# Patient Record
Sex: Male | Born: 1950
Health system: Southern US, Community
[De-identification: ages and names within clinical notes are randomized; demographics above are authoritative.]

## PROBLEM LIST (undated history)

## (undated) DIAGNOSIS — I10 Essential (primary) hypertension: Secondary | ICD-10-CM

## (undated) DIAGNOSIS — E785 Hyperlipidemia, unspecified: Secondary | ICD-10-CM

## (undated) DIAGNOSIS — N189 Chronic kidney disease, unspecified: Secondary | ICD-10-CM

## (undated) DIAGNOSIS — C61 Malignant neoplasm of prostate: Secondary | ICD-10-CM

## (undated) HISTORY — DX: Malignant neoplasm of prostate: C61

## (undated) HISTORY — PX: COLONOSCOPY: SHX174

## (undated) HISTORY — PX: PROSTATE BIOPSY: SHX241

## (undated) HISTORY — PX: PROSTATE SURGERY: SHX751

---

## 2000-04-13 ENCOUNTER — Encounter: Payer: Self-pay | Admitting: Specialist

## 2000-04-13 ENCOUNTER — Encounter: Admission: RE | Admit: 2000-04-13 | Discharge: 2000-04-13 | Payer: Self-pay | Admitting: Specialist

## 2008-12-01 HISTORY — PX: OTHER SURGICAL HISTORY: SHX169

## 2010-03-05 ENCOUNTER — Ambulatory Visit (HOSPITAL_COMMUNITY)
Admission: RE | Admit: 2010-03-05 | Discharge: 2010-03-06 | Payer: Self-pay | Source: Home / Self Care | Admitting: Orthopedic Surgery

## 2010-03-05 HISTORY — PX: OTHER SURGICAL HISTORY: SHX169

## 2011-02-19 LAB — COMPREHENSIVE METABOLIC PANEL
ALT: 16 U/L (ref 0–53)
AST: 17 U/L (ref 0–37)
Albumin: 4.1 g/dL (ref 3.5–5.2)
Alkaline Phosphatase: 60 U/L (ref 39–117)
BUN: 11 mg/dL (ref 6–23)
CO2: 26 mEq/L (ref 19–32)
Calcium: 9.5 mg/dL (ref 8.4–10.5)
Chloride: 105 mEq/L (ref 96–112)
Creatinine, Ser: 0.93 mg/dL (ref 0.4–1.5)
GFR calc Af Amer: >60 mL/min (ref >60)
GFR calc non Af Amer: >60 mL/min (ref >60)
Glucose, Bld: 94 mg/dL (ref 70–99)
Potassium: 3.1 mEq/L — ABNORMAL LOW (ref 3.5–5.1)
Sodium: 136 mEq/L (ref 135–145)
Total Bilirubin: 0.9 mg/dL (ref 0.3–1.2)
Total Protein: 8 g/dL (ref 6.0–8.3)

## 2011-02-19 LAB — CBC
HCT: 31.6 % — ABNORMAL LOW (ref 39.0–52.0)
Hemoglobin: 10.8 g/dL — ABNORMAL LOW (ref 13.0–17.0)
MCHC: 34 g/dL (ref 30.0–36.0)
MCV: 102.4 fL — ABNORMAL HIGH (ref 78.0–100.0)
Platelets: 236 10*3/uL (ref 150–400)
RBC: 3.09 MIL/uL — ABNORMAL LOW (ref 4.22–5.81)
RDW: 15.6 % — ABNORMAL HIGH (ref 11.5–15.5)
WBC: 7.8 10*3/uL (ref 4.0–10.5)

## 2011-02-19 LAB — URINALYSIS, ROUTINE W REFLEX MICROSCOPIC
Bilirubin Urine: NEGATIVE
Glucose, UA: NEGATIVE mg/dL
Hgb urine dipstick: NEGATIVE
Ketones, ur: NEGATIVE mg/dL
Leukocytes, UA: NEGATIVE
Nitrite: NEGATIVE
Protein, ur: 30 mg/dL — AB
Specific Gravity, Urine: 1.02 (ref 1.005–1.030)
Urobilinogen, UA: 1 mg/dL (ref 0.0–1.0)
pH: 5.5 (ref 5.0–8.0)

## 2011-02-19 LAB — PROTIME-INR
INR: 1.02 (ref 0.00–1.49)
Prothrombin Time: 13.3 seconds (ref 11.6–15.2)

## 2011-02-19 LAB — APTT: aPTT: 30 seconds (ref 24–37)

## 2011-02-19 LAB — URINE MICROSCOPIC-ADD ON

## 2011-11-10 ENCOUNTER — Ambulatory Visit (INDEPENDENT_AMBULATORY_CARE_PROVIDER_SITE_OTHER): Payer: Self-pay | Admitting: Family Medicine

## 2011-11-10 DIAGNOSIS — E785 Hyperlipidemia, unspecified: Secondary | ICD-10-CM

## 2011-11-10 DIAGNOSIS — I10 Essential (primary) hypertension: Secondary | ICD-10-CM

## 2012-03-18 ENCOUNTER — Other Ambulatory Visit: Payer: Self-pay | Admitting: Family Medicine

## 2012-05-03 ENCOUNTER — Ambulatory Visit (INDEPENDENT_AMBULATORY_CARE_PROVIDER_SITE_OTHER): Payer: BC Managed Care – PPO | Admitting: Family Medicine

## 2012-05-03 ENCOUNTER — Encounter: Payer: Self-pay | Admitting: Family Medicine

## 2012-05-03 VITALS — BP 127/80 | HR 84 | Temp 98.0°F | Resp 16 | Ht 69.5 in | Wt 190.4 lb

## 2012-05-03 DIAGNOSIS — I10 Essential (primary) hypertension: Secondary | ICD-10-CM

## 2012-05-03 DIAGNOSIS — E785 Hyperlipidemia, unspecified: Secondary | ICD-10-CM

## 2012-05-03 MED ORDER — PRAVASTATIN SODIUM 40 MG PO TABS: 40.0000 mg | ORAL_TABLET | Freq: Every day | ORAL | Status: DC

## 2012-05-03 MED ORDER — VERAPAMIL HCL 180 MG (CO) PO TB24: 180.0000 mg | ORAL_TABLET | Freq: Every day | ORAL | Status: DC

## 2012-05-03 MED ORDER — HYDROCHLOROTHIAZIDE 25 MG PO TABS: 25.0000 mg | ORAL_TABLET | Freq: Every day | ORAL | Status: DC

## 2012-05-03 NOTE — Progress Notes (Signed)
  Subjective:    Patient ID: Alan Shah, male    DOB: [birth year], 61 y.o.   MRN: [id number]  HPI  Patient presents to clinic for routine follow up. Compliant with medications and denies side effects.  Retired  Exercises 1+ hours everyday Careful about food choices and portions  Health Maintenance:  Colonoscopy-2011 Tdap-2011 Zostavax-2012  Review of Systems     Objective:   Physical Exam  Constitutional: He appears well-developed.  Neck: Neck supple. No thyromegaly present.  Cardiovascular: Normal rate, regular rhythm and normal heart sounds.   Pulmonary/Chest: Effort normal and breath sounds normal.  Abdominal: Soft. Bowel sounds are normal.       No HSM  Neurological: He is alert.  Skin: Skin is warm.         Assessment & Plan:   1. HTN (hypertension)  verapamil (COVERA HS) 180 MG (CO) 24 hr tablet, hydrochlorothiazide (HYDRODIURIL) 25 MG tablet, Comprehensive metabolic panel  2. Dyslipidemia  pravastatin (PRAVACHOL) 40 MG tablet, Lipid panel   Follow up 2014 for CPE

## 2012-05-04 LAB — COMPREHENSIVE METABOLIC PANEL
ALT: 13 U/L (ref 0–53)
AST: 18 U/L (ref 0–37)
Albumin: 4.5 g/dL (ref 3.5–5.2)
Alkaline Phosphatase: 51 U/L (ref 39–117)
BUN: 36 mg/dL — ABNORMAL HIGH (ref 6–23)
CO2: 25 mEq/L (ref 19–32)
Calcium: 10 mg/dL (ref 8.4–10.5)
Chloride: 106 mEq/L (ref 96–112)
Creat: 1.59 mg/dL — ABNORMAL HIGH (ref 0.50–1.35)
Glucose, Bld: 81 mg/dL (ref 70–99)
Potassium: 4.4 mEq/L (ref 3.5–5.3)
Sodium: 140 mEq/L (ref 135–145)
Total Bilirubin: 0.5 mg/dL (ref 0.3–1.2)
Total Protein: 7.8 g/dL (ref 6.0–8.3)

## 2012-05-04 LAB — LIPID PANEL
Cholesterol: 170 mg/dL (ref 0–200)
HDL: 61 mg/dL (ref >39)
LDL Cholesterol: 98 mg/dL (ref 0–99)
Total CHOL/HDL Ratio: 2.8 Ratio
Triglycerides: 53 mg/dL (ref <150)
VLDL: 11 mg/dL (ref 0–40)

## 2012-06-01 ENCOUNTER — Encounter: Payer: Self-pay | Admitting: Family Medicine

## 2012-06-01 ENCOUNTER — Ambulatory Visit (INDEPENDENT_AMBULATORY_CARE_PROVIDER_SITE_OTHER): Payer: BC Managed Care – PPO | Admitting: Family Medicine

## 2012-06-01 VITALS — BP 127/78 | HR 87 | Temp 98.6°F | Resp 16 | Ht 68.5 in | Wt 194.0 lb

## 2012-06-01 DIAGNOSIS — I10 Essential (primary) hypertension: Secondary | ICD-10-CM | POA: Insufficient documentation

## 2012-06-01 DIAGNOSIS — N289 Disorder of kidney and ureter, unspecified: Secondary | ICD-10-CM

## 2012-06-01 DIAGNOSIS — E663 Overweight: Secondary | ICD-10-CM | POA: Insufficient documentation

## 2012-06-01 LAB — BASIC METABOLIC PANEL
BUN: 26 mg/dL — ABNORMAL HIGH (ref 6–23)
CO2: 24 mEq/L (ref 19–32)
Calcium: 9.9 mg/dL (ref 8.4–10.5)
Chloride: 105 mEq/L (ref 96–112)
Creat: 1.33 mg/dL (ref 0.50–1.35)
Glucose, Bld: 88 mg/dL (ref 70–99)
Potassium: 4.1 mEq/L (ref 3.5–5.3)
Sodium: 139 mEq/L (ref 135–145)

## 2012-06-01 NOTE — Progress Notes (Signed)
  Subjective:    Patient ID: Alan Shah, male    DOB: [birth year], 62 y.o.   MRN: [id number]  HPI   This 61 y.o. AA male has HTN who was asked to return for recheck of Renal function. He is compliant with  BP meds (Verapamil and HCTZ). He maintains adequate hydration, even when exercising at home (treadmill  and strength training). He avoids most caffeine but does consume sweet tea. He is retired from [employer] and stays  active with yard work.   He denies HA, CP, SOB, dizziness, syncope or weakness.    Review of Systems  Constitutional: Negative.   Eyes: Negative for visual disturbance.  Respiratory: Negative for cough, chest tightness and shortness of breath.   Cardiovascular: Negative for chest pain, palpitations and leg swelling.  Musculoskeletal: Negative.   Neurological: Positive for dizziness. Negative for syncope, weakness, light-headedness and headaches.       Objective:   Physical Exam  Nursing note and vitals reviewed. Constitutional: He is oriented to person, place, and time. He appears well-developed and well-nourished. No distress.  HENT:  Head: Normocephalic and atraumatic.  Eyes: Conjunctivae are normal. No scleral icterus.  Cardiovascular: Normal rate and regular rhythm.   Pulmonary/Chest: Effort normal. No respiratory distress.  Musculoskeletal: He exhibits no edema.  Neurological: He is alert and oriented to person, place, and time. No cranial nerve deficit. Coordination normal.  Skin: Skin is warm and dry.          Assessment & Plan:   1. HTN (hypertension)  Basic metabolic panel, Vitamin D, 25-hydroxy Continue current meds and advised maintaining hydration, esp with yard work and aerobic exercise  2. Renal insufficiency - BUN and Cr elevated suggesting mild dehydration (2 years ago Cr=0.93)

## 2012-06-01 NOTE — Patient Instructions (Signed)
Vitamin D Deficiency  Not having enough vitamin D is called a deficiency. Your body needs this vitamin to keep your bones strong and healthy. Having too little of it can make your bones soft or can cause other health problems.  HOME CARE  Take all vitamins, herbs, or nutrition drinks (supplements) as told by your doctor.   Have your blood tested 2 months after taking vitamins, herbs, or nutrition drinks.   Eat foods that have vitamin D. This includes:   Dairy products, cereals, or juices with added vitamin D. Check the label.   Fatty fish like salmon or trout.   Eggs.   Oysters.   Go outside for 10 to 15 minutes when the sun is shining. Do this 3 times a week. Do not do this if you have skin cancer.   Do not use tanning beds.   Stay at a healthy weight. Lose weight if needed.   Keep all doctor visits as told.  GET HELP IF:  You have questions.   You continue to have problems.   You feel sick to your stomach (nauseous) or throw up (vomit).   You cannot go poop (constipated).   You feel confused.   You have severe belly (abdominal) or back pain.  MAKE SURE YOU:  Understand these instructions.   Will watch your condition.   Will get help right away if you are not doing well or get worse.  Document Released: 11/06/2011 Document Reviewed: 11/04/2011 [facility] Patient Information 2012 [place].

## 2012-06-02 ENCOUNTER — Other Ambulatory Visit: Payer: Self-pay | Admitting: Family Medicine

## 2012-06-02 LAB — VITAMIN D 25 HYDROXY (VIT D DEFICIENCY, FRACTURES): Vit D, 25-Hydroxy: 14 ng/mL — ABNORMAL LOW (ref 30–89)

## 2012-06-02 MED ORDER — ERGOCALCIFEROL 1.25 MG (50000 UT) PO CAPS: 50000.0000 [IU] | ORAL_CAPSULE | ORAL | Status: DC

## 2012-06-02 NOTE — Progress Notes (Signed)
Quick Note:  Please call pt and advise that the renal labs are normal; his kidney function has improved since last check 1 month ago. Continue current medications and maintain good hydration as we discussed.  His Vitamin D level is low; I am prescribing Vitamin D 50,000 IU 1 capsule once a week until there are no more refills. Medication is at pharmacy. Get the Multivitamin for Men that we discussed.  Need to get 10-15 minutes of sun exposure each day and try to consume salmon / tuna/ sardines, eggs, mushrooms and some dairy products for Vitamin D.  Copy to pt.  ______

## 2012-06-17 ENCOUNTER — Encounter: Payer: Self-pay | Admitting: Family Medicine

## 2012-06-18 ENCOUNTER — Encounter: Payer: Self-pay | Admitting: Family Medicine

## 2012-07-11 ENCOUNTER — Other Ambulatory Visit: Payer: Self-pay | Admitting: Physician Assistant

## 2012-09-25 ENCOUNTER — Encounter (HOSPITAL_COMMUNITY): Payer: Self-pay | Admitting: Emergency Medicine

## 2012-09-25 ENCOUNTER — Emergency Department (HOSPITAL_COMMUNITY)
Admission: EM | Admit: 2012-09-25 | Discharge: 2012-09-26 | Disposition: A | Discharge status: Home / Self Care | Payer: BC Managed Care – PPO | Attending: Emergency Medicine | Admitting: Emergency Medicine

## 2012-09-25 DIAGNOSIS — IMO0002 Reserved for concepts with insufficient information to code with codable children: Secondary | ICD-10-CM | POA: Insufficient documentation

## 2012-09-25 DIAGNOSIS — F1010 Alcohol abuse, uncomplicated: Secondary | ICD-10-CM | POA: Insufficient documentation

## 2012-09-25 DIAGNOSIS — W1809XA Striking against other object with subsequent fall, initial encounter: Secondary | ICD-10-CM | POA: Insufficient documentation

## 2012-09-25 DIAGNOSIS — Y92009 Unspecified place in unspecified non-institutional (private) residence as the place of occurrence of the external cause: Secondary | ICD-10-CM | POA: Insufficient documentation

## 2012-09-25 DIAGNOSIS — W19XXXA Unspecified fall, initial encounter: Secondary | ICD-10-CM

## 2012-09-25 DIAGNOSIS — F10929 Alcohol use, unspecified with intoxication, unspecified: Secondary | ICD-10-CM

## 2012-09-25 DIAGNOSIS — E7800 Pure hypercholesterolemia, unspecified: Secondary | ICD-10-CM | POA: Insufficient documentation

## 2012-09-25 DIAGNOSIS — I10 Essential (primary) hypertension: Secondary | ICD-10-CM | POA: Insufficient documentation

## 2012-09-25 DIAGNOSIS — Y9389 Activity, other specified: Secondary | ICD-10-CM | POA: Insufficient documentation

## 2012-09-25 DIAGNOSIS — R4182 Altered mental status, unspecified: Secondary | ICD-10-CM | POA: Insufficient documentation

## 2012-09-25 DIAGNOSIS — S0003XA Contusion of scalp, initial encounter: Secondary | ICD-10-CM | POA: Insufficient documentation

## 2012-09-25 DIAGNOSIS — N289 Disorder of kidney and ureter, unspecified: Secondary | ICD-10-CM | POA: Insufficient documentation

## 2012-09-25 DIAGNOSIS — Z7982 Long term (current) use of aspirin: Secondary | ICD-10-CM | POA: Insufficient documentation

## 2012-09-25 DIAGNOSIS — S0083XA Contusion of other part of head, initial encounter: Secondary | ICD-10-CM | POA: Insufficient documentation

## 2012-09-25 HISTORY — DX: Essential (primary) hypertension: I10

## 2012-09-25 NOTE — ED Notes (Signed)
Pt fell in his bathroom. Pt head went through part of a wall. Patient immobilized. Pt has ETOH on board. Some repetitive questions. Altered LOC, not following commands at this point.

## 2012-09-25 NOTE — ED Notes (Signed)
Pt has bilateral hematomas over eyes. Abrasion noted L forearm.

## 2012-09-26 ENCOUNTER — Emergency Department (HOSPITAL_COMMUNITY): Payer: BC Managed Care – PPO

## 2012-09-26 LAB — ETHANOL: Alcohol, Ethyl (B): 402 mg/dL (ref 0–11)

## 2012-09-26 NOTE — ED Provider Notes (Signed)
History     CSN: [id number]  Arrival date & time 09/25/12  2355   First MD Initiated Contact with Patient 09/26/12 0020      Chief Complaint  Patient presents with  . Fall    (Consider location/radiation/quality/duration/timing/severity/associated sxs/prior treatment) HPI Comments: Patient presents from home after a fall that occurred approximately 2 hours ago. Patient is alert however is only oriented to person. Friend is at bedside. Friend was at the patient's house, where he lives alone, and states he is acting normally. Patient went to bed and the friend heard the patient fall. Patient apparently struck the front of his head against a wall. Patient admits to drinking 2 beers prior to arrival. He denies chest pain or abdominal pain. Patient denies pain in his arms or his legs. He denies headache. Denies blurry vision. No nausea or vomiting. Onset was acute. Course is constant. Nothing makes symptoms better worse. Per previous medical records patient has history of hypertension, high cholesterol, renal insufficiency. The patient does not take any blood thinning medications.  The history is provided by the patient and a friend.    Past Medical History  Diagnosis Date  . Hypertension     History reviewed. No pertinent past surgical history.  No family history on file.  History  Substance Use Topics  . Smoking status: Never Smoker   . Smokeless tobacco: Not on file  . Alcohol Use: Not on file      Review of Systems  Unable to perform ROS: Mental status change    Allergies  Review of patient's allergies indicates no known allergies.  Home Medications   Current Outpatient Rx  Name Route Sig Dispense Refill  . ASPIRIN 81 MG PO TABS Oral Take 81 mg by mouth daily.    . ERGOCALCIFEROL 50000 UNITS PO CAPS Oral Take 1 capsule (50,000 Units total) by mouth once a week. 4 capsule 5  . HYDROCHLOROTHIAZIDE 25 MG PO TABS Oral Take 1 tablet (25 mg total) by mouth daily. 90  tablet 2  . PRAVASTATIN SODIUM 40 MG PO TABS Oral Take 1 tablet (40 mg total) by mouth daily. 90 tablet 2  . VERAPAMIL HCL ER (CO) 180 MG PO TB24 Oral Take 1 tablet (180 mg total) by mouth at bedtime. 90 tablet 2    BP 127/78  Pulse 85  Temp 97.9 F (36.6 C) (Oral)  Resp 18  SpO2 100%  Physical Exam  Nursing note and vitals reviewed. Constitutional: He appears well-developed and well-nourished.  HENT:  Head: Normocephalic. Head is without raccoon's eyes and without Battle's sign.  Right Ear: Tympanic membrane, external ear and ear canal normal. No hemotympanum.  Left Ear: Tympanic membrane, external ear and ear canal normal. No hemotympanum.  Nose: Nose normal. No nasal septal hematoma.  Mouth/Throat: Oropharynx is clear and moist.       Contusion/abrasion bilateral forehead  Eyes: Conjunctivae normal, EOM and lids are normal. Pupils are equal, round, and reactive to light.       No visible hyphema  Neck: Normal range of motion. Neck supple. No tracheal deviation present.  Cardiovascular: Normal rate and regular rhythm.   Pulmonary/Chest: Effort normal and breath sounds normal. No respiratory distress. He exhibits no tenderness.  Abdominal: Soft. Bowel sounds are normal. There is no tenderness. There is no rebound and no guarding.  Musculoskeletal: Normal range of motion.       Cervical back: He exhibits normal range of motion, no tenderness and no bony tenderness.  Thoracic back: He exhibits no tenderness and no bony tenderness.       Lumbar back: He exhibits no tenderness and no bony tenderness.  Neurological: He is alert. He has normal strength and normal reflexes. Coordination normal. GCS eye subscore is 4. GCS verbal subscore is 5. GCS motor subscore is 6.       Neuro exam limited 2/2 ETOH intoxication  Skin: Skin is warm and dry.  Psychiatric: He has a normal mood and affect.    ED Course  Procedures (including critical care time)  Labs Reviewed  ETHANOL -  Abnormal; Notable for the following:    Alcohol, Ethyl (B) 402 (*)     All other components within normal limits  LAB REPORT - SCANNED   Ct Head Wo Contrast  09/26/2012  *RADIOLOGY REPORT*  Clinical Data:  Fall, head injury  CT HEAD WITHOUT CONTRAST CT CERVICAL SPINE WITHOUT CONTRAST  Technique:  Multidetector CT imaging of the head and cervical spine was performed following the standard protocol without intravenous contrast.  Multiplanar CT image reconstructions of the cervical spine were also generated.  Comparison:  None.  CT HEAD  Findings: No evidence of parenchymal hemorrhage or extra-axial fluid collection. No mass lesion, mass effect, or midline shift.  No CT evidence of acute infarction.  Subcortical white matter and periventricular small vessel ischemic changes.  Right basal ganglia lacunar infarct.  Global cortical atrophy.  Secondary ventricular prominence.  The visualized paranasal sinuses are essentially clear. The mastoid air cells are unopacified.  No evidence of calvarial fracture.  IMPRESSION: No evidence of acute intracranial abnormality.  Right basal ganglia lacunar infarct.  Atrophy with small vessel ischemic changes.  CT CERVICAL SPINE  Findings: Straightening of the cervical spine, positive positional.  No evidence of fracture or dislocation.  Vertebral body heights are maintained.  The dens appears intact.  No prevertebral soft tissue swelling.  Moderate multilevel degenerative changes.  Visualized thyroid is unremarkable.  Visualized lung apices are essentially clear.  IMPRESSION: No evidence of traumatic injury to the cervical spine.  Moderate multilevel degenerative changes.   Original Report Authenticated By: Julian Hy, M.D.    Ct Cervical Spine Wo Contrast  09/26/2012  *RADIOLOGY REPORT*  Clinical Data:  Fall, head injury  CT HEAD WITHOUT CONTRAST CT CERVICAL SPINE WITHOUT CONTRAST  Technique:  Multidetector CT imaging of the head and cervical spine was performed  following the standard protocol without intravenous contrast.  Multiplanar CT image reconstructions of the cervical spine were also generated.  Comparison:  None.  CT HEAD  Findings: No evidence of parenchymal hemorrhage or extra-axial fluid collection. No mass lesion, mass effect, or midline shift.  No CT evidence of acute infarction.  Subcortical white matter and periventricular small vessel ischemic changes.  Right basal ganglia lacunar infarct.  Global cortical atrophy.  Secondary ventricular prominence.  The visualized paranasal sinuses are essentially clear. The mastoid air cells are unopacified.  No evidence of calvarial fracture.  IMPRESSION: No evidence of acute intracranial abnormality.  Right basal ganglia lacunar infarct.  Atrophy with small vessel ischemic changes.  CT CERVICAL SPINE  Findings: Straightening of the cervical spine, positive positional.  No evidence of fracture or dislocation.  Vertebral body heights are maintained.  The dens appears intact.  No prevertebral soft tissue swelling.  Moderate multilevel degenerative changes.  Visualized thyroid is unremarkable.  Visualized lung apices are essentially clear.  IMPRESSION: No evidence of traumatic injury to the cervical spine.  Moderate multilevel degenerative changes.  Original Report Authenticated By: Julian Hy, M.D.      1. Fall   2. Alcohol intoxication     12:27 AM Patient seen and examined. Work-up initiated. CT ordered. No blood thinners per records.   Vital signs reviewed and are as follows: Filed Vitals:   09/26/12 0021  BP: 127/78  Pulse: 85  Temp:   Resp: 18   CT reviewed by myself. Radiologist report reviewed. No acute process.   Patient d/w Dr. Dorna Mai. ETOH ordered. Patient now AAOx3 after CT.   ETOH > 400. Will monitor.   Patient's friend agrees to transport home and watch tonight.   Patient/friend urged to return with worsening symptoms or other concerns. Patient verbalized understanding and agrees  with plan.   Patient was counseled on head injury precautions and symptoms that should indicate their return to the ED.  These include severe worsening headache, vision changes, confusion, loss of consciousness, trouble walking, nausea & vomiting, or weakness/tingling in extremities.      MDM  Fall, iniitally not fully oriented. Imaging neg. Improved in ED -- ETOH > 400. Confusion likely 2/2 ETOH intoxication. Patient has friend who will monitor at home. No concern for closed head injury given improvement and CT findings. No other injuries noted.         Carlisle Cater, [place] 09/27/12 1932

## 2012-09-26 NOTE — ED Notes (Signed)
Pt. Has ride home and has someone to watch over him tonight to ensure safety

## 2012-10-04 NOTE — ED Provider Notes (Signed)
Medical screening examination/treatment/procedure(s) were performed by non-physician practitioner and as supervising physician I was immediately available for consultation/collaboration.   Saddie Benders. Dorna Mai, MD 10/04/12 2119

## 2012-11-07 ENCOUNTER — Other Ambulatory Visit: Payer: Self-pay | Admitting: Family Medicine

## 2012-12-01 ENCOUNTER — Other Ambulatory Visit: Payer: Self-pay | Admitting: Radiology

## 2012-12-01 DIAGNOSIS — I10 Essential (primary) hypertension: Secondary | ICD-10-CM

## 2012-12-01 MED ORDER — VERAPAMIL HCL 180 MG (CO) PO TB24: 180.0000 mg | ORAL_TABLET | Freq: Every day | ORAL | Status: DC

## 2012-12-01 MED ORDER — HYDROCHLOROTHIAZIDE 25 MG PO TABS: 25.0000 mg | ORAL_TABLET | Freq: Every day | ORAL | Status: DC

## 2013-01-05 ENCOUNTER — Ambulatory Visit (INDEPENDENT_AMBULATORY_CARE_PROVIDER_SITE_OTHER): Payer: BC Managed Care – PPO | Admitting: Family Medicine

## 2013-01-05 ENCOUNTER — Encounter: Payer: Self-pay | Admitting: Family Medicine

## 2013-01-05 VITALS — BP 139/82 | HR 76 | Temp 98.2°F | Resp 16 | Ht 68.5 in | Wt 186.0 lb

## 2013-01-05 DIAGNOSIS — E7800 Pure hypercholesterolemia, unspecified: Secondary | ICD-10-CM

## 2013-01-05 DIAGNOSIS — R195 Other fecal abnormalities: Secondary | ICD-10-CM

## 2013-01-05 DIAGNOSIS — E785 Hyperlipidemia, unspecified: Secondary | ICD-10-CM

## 2013-01-05 DIAGNOSIS — Z0000 Encounter for general adult medical examination without abnormal findings: Secondary | ICD-10-CM

## 2013-01-05 DIAGNOSIS — I10 Essential (primary) hypertension: Secondary | ICD-10-CM

## 2013-01-05 DIAGNOSIS — N400 Enlarged prostate without lower urinary tract symptoms: Secondary | ICD-10-CM

## 2013-01-05 DIAGNOSIS — E559 Vitamin D deficiency, unspecified: Secondary | ICD-10-CM

## 2013-01-05 LAB — POCT URINALYSIS DIPSTICK
Bilirubin, UA: NEGATIVE
Blood, UA: NEGATIVE
Glucose, UA: NEGATIVE
Ketones, UA: NEGATIVE
Leukocytes, UA: NEGATIVE
Nitrite, UA: NEGATIVE
Protein, UA: 30
Spec Grav, UA: 1.025
Urobilinogen, UA: 0.2
pH, UA: 5.5

## 2013-01-05 LAB — IFOBT (OCCULT BLOOD): IFOBT: POSITIVE

## 2013-01-05 MED ORDER — VERAPAMIL HCL 180 MG (CO) PO TB24: 180.0000 mg | ORAL_TABLET | Freq: Every day | ORAL | Status: DC

## 2013-01-05 MED ORDER — HYDROCHLOROTHIAZIDE 25 MG PO TABS: 25.0000 mg | ORAL_TABLET | Freq: Every day | ORAL | Status: DC

## 2013-01-05 MED ORDER — PRAVASTATIN SODIUM 40 MG PO TABS: 40.0000 mg | ORAL_TABLET | Freq: Every day | ORAL | Status: DC

## 2013-01-05 NOTE — Patient Instructions (Addendum)
Keeping you healthy  Get these tests  Blood pressure- Have your blood pressure checked once a year by your healthcare provider.  Normal blood pressure is 120/80  Weight- Have your body mass index (BMI) calculated to screen for obesity.  BMI is a measure of body fat based on height and weight. You can also calculate your own BMI at [employer]Bank[employer].si.  Cholesterol- Have your cholesterol checked every year.  Diabetes- Have your blood sugar checked regularly if you have high blood pressure, high cholesterol, have a family history of diabetes or if you are overweight.  Screening for Colon Cancer- Colonoscopy starting at age 35.  Screening may begin sooner depending on your family history and other health conditions. Follow up colonoscopy as directed by your Gastroenterologist. Your stool test in the office is positive; you will be given some test supplies to take home; please return them within the next 2 weeks.  Screening for Prostate Cancer- Both blood work (PSA) and a rectal exam help screen for Prostate Cancer.  Screening begins at age 19 with African-American men and at age 44 with Caucasian men.  Screening may begin sooner depending on your family history. Your prostate gland was mildly enlarged; we will advise you of the results of your blood test and further evaluation if needed.  Take these medicines  Aspirin- One aspirin daily can help prevent Heart disease and Stroke.  Flu shot- Every fall.  Tetanus- Every 10 years.  Zostavax- Once after the age of 26 to prevent Shingles.  Pneumonia shot- Once after the age of 70; if you are younger than 36, ask your healthcare provider if you need a Pneumonia shot.  Take these steps  Don't smoke- If you do smoke, talk to your doctor about quitting.  For tips on how to quit, go to www.smokefree.gov or call 1-800-QUIT-NOW.  Be physically active- Exercise 5 days a week for at least 30 minutes.  If you are not already physically active start  slow and gradually work up to 30 minutes of moderate physical activity.  Examples of moderate activity include walking briskly, mowing the yard, dancing, swimming, bicycling, etc.  Eat a healthy diet- Eat a variety of healthy food such as fruits, vegetables, low fat milk, low fat cheese, yogurt, lean meant, poultry, fish, beans, tofu, etc. For more information go to www.thenutritionsource.org  Drink alcohol in moderation- Limit alcohol intake to less than two drinks a day. Never drink and drive.  Dentist- Brush and floss twice daily; visit your dentist twice a year.  Depression- Your emotional health is as important as your physical health. If you're feeling down, or losing interest in things you would normally enjoy please talk to your healthcare provider.  Eye exam- Visit your eye doctor every year.  Safe sex- If you may be exposed to a sexually transmitted infection, use a condom.  Seat belts- Seat belts can save your life; always wear one.  Smoke/Carbon Monoxide detectors- These detectors need to be installed on the appropriate level of your home.  Replace batteries at least once a year.  Skin cancer- When out in the sun, cover up and use sunscreen 15 SPF or higher.  Violence- If anyone is threatening you, please tell your healthcare provider.  Living Will/ Health care power of attorney- Speak with your healthcare provider and family.

## 2013-01-10 ENCOUNTER — Encounter: Payer: Self-pay | Admitting: Family Medicine

## 2013-01-10 DIAGNOSIS — E7800 Pure hypercholesterolemia, unspecified: Secondary | ICD-10-CM | POA: Insufficient documentation

## 2013-01-10 DIAGNOSIS — N400 Enlarged prostate without lower urinary tract symptoms: Secondary | ICD-10-CM | POA: Insufficient documentation

## 2013-01-10 DIAGNOSIS — E559 Vitamin D deficiency, unspecified: Secondary | ICD-10-CM | POA: Insufficient documentation

## 2013-01-10 NOTE — Progress Notes (Signed)
Subjective:    Patient ID: Alan Shah, male    DOB: [birth year], 62 y.o.   MRN: [id number]  HPI  This 62 y.o. AA male is here for CPE and medication refills. His BP is well controlled on  current medications w/o side effects. He takes a statin to lower lipids; he reports no myalgias or  other adverse reactions.    Review of Systems  Constitutional: Negative.   HENT: Negative.   Eyes: Negative.   Respiratory: Negative.   Cardiovascular: Negative.   Gastrointestinal: Negative.   Endocrine: Negative.   Genitourinary: Negative.   Musculoskeletal: Negative.   Skin: Negative.   Allergic/Immunologic: Negative.   Neurological: Negative.   Hematological: Negative.   Psychiatric/Behavioral: Negative.        Objective:   Physical Exam  Nursing note and vitals reviewed. Constitutional: He is oriented to person, place, and time. Vital signs are normal. He appears well-developed and well-nourished. No distress.  HENT:  Head: Normocephalic and atraumatic.  Right Ear: Hearing, tympanic membrane, external ear and ear canal normal.  Left Ear: Hearing, tympanic membrane, external ear and ear canal normal.  Nose: Nose normal. No nasal deformity or septal deviation.  Mouth/Throat: Uvula is midline, oropharynx is clear and moist and mucous membranes are normal. No oral lesions. Normal dentition.  Eyes: Conjunctivae and EOM are normal. Pupils are equal, round, and reactive to light. No scleral icterus.  Neck: Normal range of motion. Neck supple. No JVD present. No thyromegaly present.  Cardiovascular: Normal rate, regular rhythm, normal heart sounds and intact distal pulses.  Exam reveals no gallop and no friction rub.   No murmur heard. Pulmonary/Chest: Effort normal and breath sounds normal. No respiratory distress. He has no wheezes. He exhibits no tenderness.  Abdominal: Soft. Bowel sounds are normal. He exhibits no distension, no abdominal bruit, no pulsatile midline mass and no mass. There  is no hepatosplenomegaly. There is no tenderness. There is no guarding and no CVA tenderness. No hernia. Hernia confirmed negative in the right inguinal area and confirmed negative in the left inguinal area.  Genitourinary: Testes normal and penis normal. Rectal exam shows external hemorrhoid. Rectal exam shows no fissure, no mass, no tenderness and anal tone normal. Guaiac positive stool. Prostate is enlarged. Prostate is not tender.  Musculoskeletal: Normal range of motion. He exhibits no edema and no tenderness.  Lymphadenopathy:    He has no cervical adenopathy.       Right: No inguinal adenopathy present.       Left: No inguinal adenopathy present.  Neurological: He is alert and oriented to person, place, and time. He has normal reflexes. No cranial nerve deficit. He exhibits normal muscle tone. Coordination normal.  Skin: Skin is warm and dry. No rash noted. No erythema.  Psychiatric: He has a normal mood and affect. His behavior is normal. Judgment and thought content normal.    ECG: Sinus bradycardia w/ LVH.  No ST-TW changes. No ectopy.    Results for orders placed in visit on 01/05/13  POCT URINALYSIS DIPSTICK      Result Value Range   Color, UA yellow     Clarity, UA clear     Glucose, UA neg     Bilirubin, UA neg     Ketones, UA neg     Spec Grav, UA 1.025     Blood, UA neg     pH, UA 5.5     Protein, UA 30     Urobilinogen, UA 0.2  Nitrite, UA neg     Leukocytes, UA Negative    IFOBT (OCCULT BLOOD)      Result Value Range   IFOBT Positive          Assessment & Plan:   1. Routine general medical examination at a health care facility        IFOBT POC (occult bld, rslt in office)  2. HTN (hypertension) - well controlled    EKG 12-Lead   hydrochlorothiazide (HYDRODIURIL) 25 MG tablet  3. Unspecified vitamin D deficiency  Vitamin D level =14 in July 2013.  4. Hypercholesteremia    5. Hypertrophy of prostate without urinary obstruction and other lower urinary  tract symptoms (LUTS)  Pt is asymptomatic; further evaluation depends on PSA value.  6. Positive occult stool blood test  IFOBT POC (occult bld, rslt in office)   IFOBT POC - take home   IFOBT POC - take home  7. Dyslipidemia  pravastatin (PRAVACHOL) 40 MG tablet       Meds ordered this encounter  Medications  . verapamil (COVERA HS) 180 MG (CO) 24 hr tablet    Sig: Take 1 tablet (180 mg total) by mouth at bedtime.    Dispense:  90 tablet    Refill:  3  . pravastatin (PRAVACHOL) 40 MG tablet    Sig: Take 1 tablet (40 mg total) by mouth daily.    Dispense:  90 tablet    Refill:  3  . hydrochlorothiazide (HYDRODIURIL) 25 MG tablet    Sig: Take 1 tablet (25 mg total) by mouth daily.    Dispense:  90 tablet    Refill:  3

## 2013-01-11 ENCOUNTER — Telehealth: Payer: Self-pay | Admitting: Family Medicine

## 2013-01-11 NOTE — Telephone Encounter (Signed)
Called to discuss recent labs with pt; his tests were done at [facility] so paper results reviewed today (01/11/13). No answer so I will call again on 01/12/13. Note- pt has elevated PSA= 8.2 and CR = 1.46. & months ago, Cr= 1.33

## 2013-01-12 ENCOUNTER — Telehealth: Payer: Self-pay | Admitting: Family Medicine

## 2013-01-12 DIAGNOSIS — N400 Enlarged prostate without lower urinary tract symptoms: Secondary | ICD-10-CM

## 2013-01-12 NOTE — Telephone Encounter (Signed)
Lab result from [facility] reviewed with pt; discussed PSA= 8.2. Advised pt that he will be referred to Urologist to assess enlarged prostate gland and high PSA value. Copy of labs to be mailed to pt.

## 2013-01-17 LAB — IFOBT (OCCULT BLOOD)
IFOBT: POSITIVE
IFOBT: POSITIVE

## 2013-01-20 NOTE — Progress Notes (Signed)
Quick Note:  Please call pt and advise that the following labs are abnormal...  Both stool specimen tests (hemosure done at home x 2) are positive. Pt needs to be seen by GI; last Colonoscopy was done in 2011. I want to refer back to that GI practice. Please let us know what practice that was so we can make an appointment for you to be evaluated. ______

## 2013-01-21 ENCOUNTER — Other Ambulatory Visit: Payer: Self-pay | Admitting: Family Medicine

## 2013-01-21 DIAGNOSIS — R195 Other fecal abnormalities: Secondary | ICD-10-CM

## 2013-02-15 ENCOUNTER — Encounter: Payer: Self-pay | Admitting: Family Medicine

## 2013-07-06 ENCOUNTER — Encounter: Payer: Self-pay | Admitting: Family Medicine

## 2013-07-06 ENCOUNTER — Ambulatory Visit (INDEPENDENT_AMBULATORY_CARE_PROVIDER_SITE_OTHER): Payer: BC Managed Care – PPO | Admitting: Family Medicine

## 2013-07-06 VITALS — BP 126/74 | HR 71 | Temp 98.5°F | Resp 16 | Ht 69.0 in | Wt 194.8 lb

## 2013-07-06 DIAGNOSIS — E785 Hyperlipidemia, unspecified: Secondary | ICD-10-CM

## 2013-07-06 DIAGNOSIS — I10 Essential (primary) hypertension: Secondary | ICD-10-CM

## 2013-07-06 DIAGNOSIS — E559 Vitamin D deficiency, unspecified: Secondary | ICD-10-CM

## 2013-07-06 LAB — BASIC METABOLIC PANEL
BUN: 22 mg/dL (ref 6–23)
CO2: 27 mEq/L (ref 19–32)
Calcium: 9.9 mg/dL (ref 8.4–10.5)
Chloride: 102 mEq/L (ref 96–112)
Creat: 1.32 mg/dL (ref 0.50–1.35)
Glucose, Bld: 103 mg/dL — ABNORMAL HIGH (ref 70–99)
Potassium: 4.3 mEq/L (ref 3.5–5.3)
Sodium: 137 mEq/L (ref 135–145)

## 2013-07-06 LAB — ALT: ALT: 18 U/L (ref 0–53)

## 2013-07-06 LAB — LDL CHOLESTEROL, DIRECT: Direct LDL: 70 mg/dL

## 2013-07-06 NOTE — Patient Instructions (Addendum)
Keep up the excellent lifestyle practices! Next appointment in February will be for a complete physical exam and fasting labs. You should have enough medication to last until that visit.

## 2013-07-06 NOTE — Progress Notes (Signed)
S:  This 62 y.o. AA male has HTN and dyslipidemia. He checks BP at home- 125-130/70-80. Pt is compliant w/ medications and denies adverse effects. He exercises daily at home for 1.5 - 2 hours. Pt consumes moderate amount of alcohol. Pt had significant Vit D deficiency lasy year; he did complete a course of high-potency Vit D supplement but has ot been taking an OTC supplement.   Patient Active Problem List   Diagnosis Date Noted  . Hypercholesteremia 01/10/2013  . Unspecified vitamin D deficiency 01/10/2013  . Hypertrophy of prostate without urinary obstruction and other lower urinary tract symptoms (LUTS) 01/10/2013  . HTN (hypertension) 06/01/2012  . Overweight (BMI 25.0-29.9) 06/01/2012    PMHx, Soc Hx and Fam Hx reviewed. Medications reconciled.   HCM: IMM- current; pt does not take Flu vaccine.   ROS: As per HPI; negative for fatigue, diaphoresis, decreased appetite, abnormal weight change, vision changes, CP or tightness, palpitations, edema, SOB or DOE, cough, GI problems, skin or hair changes, jaundice, myalgias, back pain, HA, numbness, weakness, tremor or syncope.   O: Filed Vitals:   07/06/13 0745  BP: 126/74                                    Weight is unchanged from 1 year ago.  Pulse: 71  Temp: 98.5 F (36.9 C)  Resp: 16   GEN: In NAD; WN,WD.   HENT: [place]/AT; EOMI w/ clear conj/sclerae. EACs/nose/oroph unremarkable. COR: RRR; no m/g/r. Pulses 2+/=. LUNGS: CTA; normal resp rate and effort. SKIN: W&D; no rashes or jaundice. MS: MAEs; no joint deformities or effusions. NEURO: A&O x 3; CNs intact. Gait normal. Nonfocal.  A/P: HTN (hypertension) - Stable and controlled. No medication change at this time.   Plan: Basic metabolic panel  Dyslipidemia -  Nonfasting; pt had breakfast this morning.   Plan: LDL Cholesterol, Direct, ALT. Continue current statin.  Unspecified vitamin D deficiency - Advised that he should be supplementing w/ OTC Vit D3.   Plan: Vitamin D,  25-hydroxy

## 2013-07-07 LAB — VITAMIN D 25 HYDROXY (VIT D DEFICIENCY, FRACTURES): Vit D, 25-Hydroxy: 39 ng/mL (ref 30–89)

## 2013-07-11 NOTE — Progress Notes (Signed)
Quick Note:  Please notify pt that results are normal.   Provide pt with copy of labs. ______ 

## 2013-07-19 ENCOUNTER — Encounter: Payer: Self-pay | Admitting: Radiation Oncology

## 2013-07-19 NOTE — Progress Notes (Signed)
GU Location of Tumor / Histology: prostate  If Prostate Cancer, Gleason Score is (3 + 4=7) and PSA is (8.2)  12/2002 PSA 10.58 12/2003 PSA   0.85 01/2013 PSA   8.2   Patient presented January 2004 with elevated PSA.  Biopsies of prostate (if applicable) revealed:     Past/Anticipated interventions by urology, if any: TRUS/biopsy; referral to Dr. Tammi Klippel  Past/Anticipated interventions by medical oncology, if any: None  Weight changes, if any: None noted  Bowel/Bladder complaints, if any: Denies change in voiding patter, dysuria, or hematuria   Nausea/Vomiting, if any: None noted  Pain issues, if any:  None noted  SAFETY ISSUES:  Prior radiation? NO  Pacemaker/ICD? NO  Possible current pregnancy? NO  Is the patient on methotrexate? 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

## 2013-07-20 ENCOUNTER — Ambulatory Visit
Admission: RE | Admit: 2013-07-20 | Discharge: 2013-07-20 | Disposition: A | Discharge status: Home / Self Care | Payer: BC Managed Care – PPO | Source: Ambulatory Visit | Attending: Radiation Oncology | Admitting: Radiation Oncology

## 2013-07-20 ENCOUNTER — Encounter: Payer: Self-pay | Admitting: Radiation Oncology

## 2013-07-20 VITALS — BP 129/78 | HR 79 | Temp 98.6°F | Resp 18 | Ht 70.0 in | Wt 197.3 lb

## 2013-07-20 VITALS — Ht 70.0 in | Wt 197.3 lb

## 2013-07-20 DIAGNOSIS — N400 Enlarged prostate without lower urinary tract symptoms: Secondary | ICD-10-CM

## 2013-07-20 NOTE — Progress Notes (Signed)
See progress noted under physician encounter.  

## 2013-07-20 NOTE — Progress Notes (Signed)
Denies bone pain. Denies night sweats and unintentional weight loss. Denies dysuria or hematuria. Reports on average he gets up once during the night to void. Denies blood in stool, diarrhea or pain associated with bowel movements. Denies nausea, vomiting, headache or dizziness. Patient explains that he only has to return to the restroom to void every two hours if he is "drinking a lot of water." Denies fatigue, decreased appetite or difficulty sleeping.

## 2013-07-20 NOTE — Progress Notes (Signed)
Radiation Oncology         [phone] ________________________________  Initial outpatient Consultation  Name: Alan Shah MRN: [id number]  Date: 07/20/2013  DOB: 04-Apr-1951  [id number] C, MD  Claybon Jabs, MD   REFERRING PHYSICIAN: Claybon Jabs, MD  DIAGNOSIS: 62 y.o. gentleman with stage T1c adenocarcinoma of the prostate with a Gleason's score of 3+4 and a PSA of 8.2  HISTORY OF PRESENT ILLNESS::Alan Shah is a 62 y.o. gentleman.  He was noted to have an elevated PSA of 8.2 in March 2014 by his primary care physician, Dr. Ellsworth Lennox. He had a transient elevation in PSA of 10.58 in January 2004 and he was recommended to undergo biopsy at that timeb Dr. Joelyn Oms, but he refused. The following PSA in January 2005 was 0.85. Due to the recent elevation in PSA, he was referred for evaluation in urology by Dr. Karsten Ro on 02/07/13,  digital rectal examination was performed at that time revealing a 2+ gland with no nodules.  The patient proceeded to transrectal ultrasound with 12 biopsies of the prostate on 02/24/2013.  The prostate volume measured 37.07 cc.  Out of 12 core biopsies,3 showed atypical glands and one of the biopsies from the right lateral mid gland showed high-grade prostatic intraepithelial neoplasia with some outpouchings potentially representing focal infiltrating adenocarcinoma.  Based on these findings, a short interval followup biopsy was pursued.  The patient proceeded to transrectal ultrasound with 12 biopsies of the prostate on 06/06/2013.  The prostate volume measured 32.58 cc.  Out of 12 core biopsies,2 were positive.  The maximum Gleason score was 3+4, and this was seen in 10% of the left lateral mid and 20% of the left lateral apex specimens.  The patient reviewed the biopsy results with his urologist and he has kindly been referred today for discussion of potential radiation treatment options.  PREVIOUS RADIATION THERAPY: No  PAST MEDICAL HISTORY:  has  a past medical history of Hypertension; Vitamin D deficiency; and Prostate cancer.    PAST SURGICAL HISTORY: Past Surgical History  Procedure Laterality Date  . Colonoscopy    . Prostate biopsy  02/24/2013  . Prostate biopsy  06/07/2013    FAMILY HISTORY: family history includes Cancer in his maternal uncle; Diabetes in his mother.  SOCIAL HISTORY:  reports that he has never smoked. He has never used smokeless tobacco. He reports that  drinks alcohol. He reports that he does not use illicit drugs.  ALLERGIES: Review of patient's allergies indicates no known allergies.  MEDICATIONS:  Current Outpatient Prescriptions  Medication Sig Dispense Refill  . aspirin 81 MG tablet Take 81 mg by mouth daily.      . hydrochlorothiazide (HYDRODIURIL) 25 MG tablet Take 1 tablet (25 mg total) by mouth daily.  90 tablet  3  . Multiple Vitamin (MULTIVITAMIN) tablet Take 1 tablet by mouth daily.      . pravastatin (PRAVACHOL) 40 MG tablet Take 1 tablet (40 mg total) by mouth daily.  90 tablet  3  . verapamil (COVERA HS) 180 MG (CO) 24 hr tablet Take 1 tablet (180 mg total) by mouth at bedtime.  90 tablet  3  . Vitamin D, Ergocalciferol, (DRISDOL) 50000 UNITS CAPS Take 1 capsule (50,000 Units total) by mouth every 7 (seven) days. Needs office visit and labs for further refills  4 capsule  0   No current facility-administered medications for this encounter.    REVIEW OF SYSTEMS:  A 15 point review of systems  is documented in the electronic medical record. This was obtained by the nursing staff. However, I reviewed this with the patient to discuss relevant findings and make appropriate changes.  A comprehensive review of systems was negative..  The patient completed an IPSS and IIEF questionnaire.  His IPSS score was 2 indicating minimal urinary outflow obstructive symptoms.  He indicated that his erectile function is able to complete sexual activity on almost every attempt.   PHYSICAL EXAM: This patient is in  no acute distress.  He is alert and oriented.   height is 5\' 10"  (1.778 m) and weight is 197 lb 4.8 oz (89.495 kg). His oral temperature is 98.6 F (37 C). His blood pressure is 129/78 and his pulse is 79. His respiration is 18 and oxygen saturation is 100%.  He exhibits no respiratory distress or labored breathing.  He appears neurologically intact.  His mood is pleasant.  His affect is appropriate.  Please note the digital rectal exam findings described above.  KPS = 100  LABORATORY DATA:  Lab Results  Component Value Date   WBC 7.8 03/05/2010   HGB 10.8* 03/05/2010   HCT 31.6* 03/05/2010   MCV 102.4* 03/05/2010   PLT 236 03/05/2010   Lab Results  Component Value Date   NA 137 07/06/2013   K 4.3 07/06/2013   CL 102 07/06/2013   CO2 27 07/06/2013   Lab Results  Component Value Date   ALT 18 07/06/2013   AST 18 05/03/2012   ALKPHOS 51 05/03/2012   BILITOT 0.5 05/03/2012     RADIOGRAPHY: No results found.    IMPRESSION: This gentleman is a 62 y.o. gentleman with stage with stage T1c adenocarcinoma of the prostate with a Gleason's score of 3+4 and a PSA of 8.2.  His T-Stage, Gleason's Score, and PSA put him into the intermediate risk group.  Accordingly he is eligible for a variety of potential treatment options including radical prostatectomy, IMRT, and he falls into a select subset of patients with intermediate risk disease involving less than one half of one lobe who can also be effectively treated with brachytherapy alone  PLAN:Today I reviewed the findings and workup thus far.  We discussed the natural history of prostate cancer.  We reviewed the the implications of T-stage, Gleason's Score, and PSA on decision-making and outcomes in prostate cancer.  We discussed radiation treatment in the management of prostate cancer with regard to the logistics and delivery of external beam radiation treatment as well as the logistics and delivery of prostate brachytherapy.  We compared and contrasted each of these approaches and  also compared these against prostatectomy.  The patient expressed interest in prostate brachytherapy.  I filled out a patient counseling form for him with relevant treatment diagrams and we retained a copy for our records.   The patient would like to proceed with prostate brachytherapy.  I will share my findings with Dr. Karsten Ro and move forward with scheduling the procedure in the near future.     I enjoyed meeting with him today, and will look forward to participating in the care of this very nice gentleman.  I spent 60 minutes face to face with the patient and more than 50% of that time was spent in counseling and/or coordination of care.   ------------------------------------------------  Sheral Apley. Tammi Klippel, M.D.

## 2013-07-21 ENCOUNTER — Telehealth: Payer: Self-pay | Admitting: *Deleted

## 2013-07-21 NOTE — Telephone Encounter (Signed)
CALLED PATIENT TO INFORM OF PRE-SEED APPT.  FOR 07-29-13 AT 11:15 AM, SPOKE WITH PATIENT AND HE IS AWARE OF THIS APPT.

## 2013-07-28 ENCOUNTER — Other Ambulatory Visit: Payer: Self-pay | Admitting: Urology

## 2013-07-28 ENCOUNTER — Telehealth: Payer: Self-pay | Admitting: *Deleted

## 2013-07-28 NOTE — Telephone Encounter (Signed)
CALLED PATIENT TO INFORM THAT APPT. FOR 07-29-13 HAS BEEN MOVED TO 1:20 PM, LVM FOR A RETURN CALL

## 2013-07-29 ENCOUNTER — Ambulatory Visit
Admission: RE | Admit: 2013-07-29 | Discharge: 2013-07-29 | Disposition: A | Discharge status: Home / Self Care | Payer: BC Managed Care – PPO | Source: Ambulatory Visit | Attending: Radiation Oncology | Admitting: Radiation Oncology

## 2013-07-29 DIAGNOSIS — C61 Malignant neoplasm of prostate: Secondary | ICD-10-CM | POA: Insufficient documentation

## 2013-07-29 DIAGNOSIS — E7800 Pure hypercholesterolemia, unspecified: Secondary | ICD-10-CM

## 2013-07-29 NOTE — Progress Notes (Signed)
  Radiation Oncology         [phone] ________________________________  Name: Alan Shah MRN: [id number]  Date: 07/29/2013  DOB: May 28, 1951  SIMULATION AND TREATMENT PLANNING NOTE PUBIC ARCH STUDY  [id number] C, MD  Claybon Jabs, MD  DIAGNOSIS: 62 y.o. gentleman with stage T1c adenocarcinoma of the prostate with a Gleason's score of 3+4 and a PSA of 8.2  COMPLEX SIMULATION:  The patient presented today for evaluation for possible prostate seed implant. He was brought to the radiation planning suite and placed supine on the CT couch. A 3-dimensional image study set was obtained in upload to the planning computer. There, on each axial slice, I contoured the prostate gland. Then, using three-dimensional radiation planning tools I reconstructed the prostate in view of the structures from the transperineal needle pathway to assess for possible pubic arch interference. In doing so, I did not appreciate any pubic arch interference. Also, the patient's prostate volume was estimated based on the drawn structure. The volume was 37 cc.  Given the pubic arch appearance and prostate volume, patient remains a good candidate to proceed with prostate seed implant. Today, he freely provided informed written consent to proceed.    PLAN: The patient will undergo prostate seed implant.   ________________________________  Sheral Apley. Tammi Klippel, M.D.

## 2013-08-02 NOTE — Progress Notes (Signed)
HAD MESSAGE LEFT ON VOICEMAIL FROM SHIRLEY AT DR MANNING OFFICE ABOUT PT TO GET CXR AND EKG DONE ON 08-03-2013 AM. HOWEVER, THIS FACILITY IS CLOSED THAT DAY DUE TO LOW CENSUS. CALLED AND SPOKE W/ PT HE WILL COME ON Thursday 08-04-2013 AT 0900  TO GET CXR AND EKG DONE.

## 2013-08-04 ENCOUNTER — Encounter (HOSPITAL_BASED_OUTPATIENT_CLINIC_OR_DEPARTMENT_OTHER)
Admission: RE | Admit: 2013-08-04 | Discharge: 2013-08-04 | Disposition: A | Discharge status: Home / Self Care | Payer: BC Managed Care – PPO | Source: Ambulatory Visit | Attending: Urology | Admitting: Urology

## 2013-08-04 ENCOUNTER — Ambulatory Visit (HOSPITAL_BASED_OUTPATIENT_CLINIC_OR_DEPARTMENT_OTHER)
Admission: RE | Admit: 2013-08-04 | Discharge: 2013-08-04 | Disposition: A | Discharge status: Home / Self Care | Payer: BC Managed Care – PPO | Source: Ambulatory Visit | Attending: Urology | Admitting: Urology

## 2013-08-04 DIAGNOSIS — C61 Malignant neoplasm of prostate: Secondary | ICD-10-CM | POA: Insufficient documentation

## 2013-08-04 DIAGNOSIS — Z01818 Encounter for other preprocedural examination: Secondary | ICD-10-CM | POA: Insufficient documentation

## 2013-08-09 ENCOUNTER — Telehealth: Payer: Self-pay | Admitting: *Deleted

## 2013-08-09 NOTE — Telephone Encounter (Signed)
CALLED PATIENT TO INFORM OF IMPLANT DATE, SPOKE WITH PATIENT AND HE IS AWARE OF THIS.

## 2013-08-25 ENCOUNTER — Telehealth: Payer: Self-pay | Admitting: *Deleted

## 2013-08-25 ENCOUNTER — Encounter (HOSPITAL_BASED_OUTPATIENT_CLINIC_OR_DEPARTMENT_OTHER): Payer: Self-pay | Admitting: *Deleted

## 2013-08-25 NOTE — Telephone Encounter (Signed)
CALLED PATIENT TO REMND OF APPT. FOR 08-26-13, LVM FOR A RETURN CALL

## 2013-08-25 NOTE — Progress Notes (Addendum)
NPO AFTER MN. ARRIVES AT 0800.  LAB WORK TO BE DONE 08-26-2013. CURRENT CXR AND EKG IN EPIC AND CHART. WILL TAKE PRAVASTATIN AM OF SURG W/ SIP OF WATER AND DO FLEET ENEMA.  SPOKE W/ PT , SAME INSTRUCTIONS AS ABOVE, BUT ARRIVE AT 0830 ON 09-08-2013.

## 2013-08-26 LAB — CBC
HCT: 37 % — ABNORMAL LOW (ref 39.0–52.0)
Hemoglobin: 12.3 g/dL — ABNORMAL LOW (ref 13.0–17.0)
MCH: 27.6 pg (ref 26.0–34.0)
MCHC: 33.2 g/dL (ref 30.0–36.0)
MCV: 83 fL (ref 78.0–100.0)
Platelets: 182 10*3/uL (ref 150–400)
RBC: 4.46 MIL/uL (ref 4.22–5.81)
RDW: 14.3 % (ref 11.5–15.5)
WBC: 4.1 10*3/uL (ref 4.0–10.5)

## 2013-08-26 LAB — COMPREHENSIVE METABOLIC PANEL
ALT: 17 U/L (ref 0–53)
AST: 20 U/L (ref 0–37)
Albumin: 4.4 g/dL (ref 3.5–5.2)
Alkaline Phosphatase: 49 U/L (ref 39–117)
BUN: 30 mg/dL — ABNORMAL HIGH (ref 6–23)
CO2: 28 mEq/L (ref 19–32)
Calcium: 10.1 mg/dL (ref 8.4–10.5)
Chloride: 99 mEq/L (ref 96–112)
Creatinine, Ser: 1.57 mg/dL — ABNORMAL HIGH (ref 0.50–1.35)
GFR calc Af Amer: 53 mL/min — ABNORMAL LOW (ref >90)
GFR calc non Af Amer: 46 mL/min — ABNORMAL LOW (ref >90)
Glucose, Bld: 113 mg/dL — ABNORMAL HIGH (ref 70–99)
Potassium: 4.4 mEq/L (ref 3.5–5.1)
Sodium: 138 mEq/L (ref 135–145)
Total Bilirubin: 0.4 mg/dL (ref 0.3–1.2)
Total Protein: 7.8 g/dL (ref 6.0–8.3)

## 2013-08-26 LAB — APTT: aPTT: 29 seconds (ref 24–37)

## 2013-08-26 LAB — PROTIME-INR
INR: 0.97 (ref 0.00–1.49)
Prothrombin Time: 12.7 seconds (ref 11.6–15.2)

## 2013-09-01 ENCOUNTER — Telehealth: Payer: Self-pay | Admitting: *Deleted

## 2013-09-01 NOTE — Telephone Encounter (Signed)
CALLED PATIENT TO REMIND OF PROCEDURE FOR 09-02-13, LVM FOR A RETURN CALL

## 2013-09-01 NOTE — Telephone Encounter (Signed)
CALLED PATIENT TO INFORM OF NEW IMPLANT DATE FOR 09-08-13 - 10:00 AM , SPOKE WITH PATIENT AND HE IS AWARE OF THIS CHANGE AND GOOD WITH IT.

## 2013-09-01 NOTE — Telephone Encounter (Signed)
Called patient to inform of implant being cancelled for 09-02-13 due to seeds not arriving, I told him that I would call him when I get it rescheduled, spoke with patient and he is aware of this and o.k. With it.

## 2013-09-07 ENCOUNTER — Telehealth: Payer: Self-pay | Admitting: *Deleted

## 2013-09-07 ENCOUNTER — Ambulatory Visit
Admission: RE | Admit: 2013-09-07 | Discharge: 2013-09-07 | Disposition: A | Discharge status: Home / Self Care | Payer: BC Managed Care – PPO | Source: Ambulatory Visit | Attending: Radiation Oncology | Admitting: Radiation Oncology

## 2013-09-07 NOTE — Telephone Encounter (Signed)
CALLED PATIENT TO REMIND OF PROCEDURE FOR 09-08-13, LVM FOR A RETURN CALL

## 2013-09-07 NOTE — H&P (Signed)
[name] is a 62 year old male with newly diagnosed prostate cancer.   History of Present Illness      Adenocarcinoma of the prostate: He was seen by Dr. Joelyn Oms and 1/04 for an elevated PSA of 10.58. A biopsy was recommended however the patient failed to return for that. A PSA in 1/05 was then found to be 0.85. He was found to have a PSA of 8.2 in 3/14. TRUS/BX 02/24/13: Prostate volume - 37.1 cc Pathology: 2 of the cores contained atypia as well as 2 cores with PIN. Repeat TRUS/BX 06/07/13; prostate volume - 32.6 cc Pathology: Adenocarcinoma Gleason (1 core 3+4 = 7, 5-10%, left mid lateral & 1 core 3+3 = 6, 20%, left apex lateral)   Interval history: No new urologic complaints are noted today.   Past Medical History Problems  1. History of  Colonoscopy (Fiberoptic) 2. History of  Hypertension 401.9 3. History of  Vitamin D Deficiency 268.9  Surgical History Problems  1. History of  Biopsy Of The Prostate Needle 2. History of  Biopsy Of The Prostate Needle 3. History of  Shoulder Surgery Right  Current Meds 1. Aspirin 81 MG Oral Tablet; Therapy: (Recorded:10Mar2014) to 2. Hydrochlorothiazide 25 MG Oral Tablet; Therapy: [id number] to 3. Pravastatin Sodium 40 MG Oral Tablet; Therapy: [id number] to 4. Verapamil HCl ER 180 MG Oral Tablet Extended Release; Therapy: [id number] to 5. Vitamin D (Ergocalciferol) 50000 UNIT Oral Capsule; Therapy: [id number] to  Allergies Medication  1. No Known Drug Allergies  Family History Problems  1. Maternal history of  Diabetes Mellitus V18.0  Social History Problems  1. Alcohol Use maybe one beer a month - not an every day drinker 2. Marital History - Single 3. Never A Smoker 4. Occupation: retired Tax adviser of [employer], constitutional, skin, eye, otolaryngeal, hematologic/lymphatic, cardiovascular, pulmonary, endocrine, musculoskeletal, gastrointestinal, neurological and psychiatric system(s) were reviewed and pertinent  findings if present are noted.  a.    Vitals Vital Signs  BMI Calculated: 26.49 BSA Calculated: 2.02 Height: 5 ft 10 in Weight: 185 lb  Blood Pressure: 122 / 76 Heart Rate: 92  Physical Exam Constitutional: Well nourished and well developed . No acute distress.  ENT:. The ears and nose are normal in appearance.  Neck: The appearance of the neck is normal and no neck mass is present.  Pulmonary: No respiratory distress and normal respiratory rhythm and effort.  Cardiovascular: Heart rate and rhythm are normal . No peripheral edema.  Abdomen: The abdomen is soft and nontender. No masses are palpated. No CVA tenderness. No hernias are palpable. No hepatosplenomegaly noted.  Rectal: Rectal exam demonstrates normal sphincter tone, no tenderness and no masses. Estimated prostate size is 2+. The prostate has no nodularity and is not tender. The left seminal vesicle is nonpalpable. The right seminal vesicle is nonpalpable. The perineum is normal on inspection.  Genitourinary: Examination of the penis demonstrates no discharge, no masses, no lesions and a normal meatus. The scrotum is without lesions. The right epididymis is palpably normal and non-tender. The left epididymis is palpably normal and non-tender. The right testis is non-tender and without masses. The left testis is non-tender and without masses.  Lymphatics: The femoral and inguinal nodes are not enlarged or tender.  Skin: Normal skin turgor, no visible rash and no visible skin lesions.  Neuro/Psych:. Mood and affect are appropriate.   Results/Data   Partin table results: His probability of organ confined disease is 70% with a 19% probability of extracapsular extension,  a 6% probability of seminal vesicle invasion and a 2.8% probability of lymph node involvement. His 5 year progression free probability with brachytherapy is 74% and his 5 and 10 year progression free probability with radical prostatectomy is 93% and 89% respectively.      Assessment Assessed  1. Adenocarcinoma Of The Prostate Gland 185  Plan     The patient was counseled about the natural history of prostate cancer and the standard treatment options that are available for prostate cancer. It was explained to him how his age and life expectancy, clinical stage, Gleason score, and PSA affect his prognosis, the decision to proceed with additional staging studies, as well as how that information influences recommended treatment strategies. We discussed the roles for active surveillance, radiation therapy, surgical therapy, androgen deprivation, as well as ablative therapy options for the treatment of prostate cancer as appropriate to his individual cancer situation. We discussed the risks and benefits of these options with regard to their impact on cancer control and also in terms of potential adverse events, complications, and impact on quiality of life particularly related to urinary, bowel, and sexual function. The patient was encouraged to ask questions throughout the discussion today and all questions were answered to his stated satisfaction. In addition, the patient was provided with and/or directed to appropriate resources and literature for further education about prostate cancer and treatment options.    He is elected to proceed with radioactive seed implantation.

## 2013-09-07 NOTE — Telephone Encounter (Signed)
Called patient to remind of procedure for 09-08-13, spoke with patient and he is aware of this procedure.

## 2013-09-08 ENCOUNTER — Encounter (HOSPITAL_BASED_OUTPATIENT_CLINIC_OR_DEPARTMENT_OTHER): Payer: BC Managed Care – PPO | Admitting: Anesthesiology

## 2013-09-08 ENCOUNTER — Ambulatory Visit (HOSPITAL_BASED_OUTPATIENT_CLINIC_OR_DEPARTMENT_OTHER): Payer: BC Managed Care – PPO | Admitting: Anesthesiology

## 2013-09-08 ENCOUNTER — Ambulatory Visit (HOSPITAL_COMMUNITY): Payer: BC Managed Care – PPO

## 2013-09-08 ENCOUNTER — Encounter (HOSPITAL_BASED_OUTPATIENT_CLINIC_OR_DEPARTMENT_OTHER)
Admission: RE | Disposition: A | Discharge status: Home / Self Care | Payer: Self-pay | Source: Ambulatory Visit | Attending: Urology

## 2013-09-08 ENCOUNTER — Encounter (HOSPITAL_BASED_OUTPATIENT_CLINIC_OR_DEPARTMENT_OTHER): Payer: Self-pay

## 2013-09-08 ENCOUNTER — Ambulatory Visit (HOSPITAL_BASED_OUTPATIENT_CLINIC_OR_DEPARTMENT_OTHER)
Admission: RE | Admit: 2013-09-08 | Discharge: 2013-09-08 | Disposition: A | Discharge status: Home / Self Care | Payer: BC Managed Care – PPO | Source: Ambulatory Visit | Attending: Urology | Admitting: Urology

## 2013-09-08 DIAGNOSIS — Z79899 Other long term (current) drug therapy: Secondary | ICD-10-CM | POA: Insufficient documentation

## 2013-09-08 DIAGNOSIS — N400 Enlarged prostate without lower urinary tract symptoms: Secondary | ICD-10-CM

## 2013-09-08 DIAGNOSIS — E559 Vitamin D deficiency, unspecified: Secondary | ICD-10-CM | POA: Insufficient documentation

## 2013-09-08 DIAGNOSIS — C61 Malignant neoplasm of prostate: Secondary | ICD-10-CM | POA: Insufficient documentation

## 2013-09-08 DIAGNOSIS — I10 Essential (primary) hypertension: Secondary | ICD-10-CM | POA: Insufficient documentation

## 2013-09-08 DIAGNOSIS — Z7982 Long term (current) use of aspirin: Secondary | ICD-10-CM | POA: Insufficient documentation

## 2013-09-08 HISTORY — PX: RADIOACTIVE SEED IMPLANT: SHX5150

## 2013-09-08 HISTORY — DX: Hyperlipidemia, unspecified: E78.5

## 2013-09-08 SURGERY — INSERTION, RADIATION SOURCE, PROSTATE
Anesthesia: General | Site: Prostate | Wound class: Clean

## 2013-09-08 MED ORDER — PROPOFOL 10 MG/ML IV BOLUS
INTRAVENOUS | Status: DC | PRN
  Administered 2013-09-08: 40 mg via INTRAVENOUS
  Administered 2013-09-08: 340 mg via INTRAVENOUS

## 2013-09-08 MED ORDER — HYDROCODONE-ACETAMINOPHEN 10-325 MG PO TABS: 1.0000 | ORAL_TABLET | Freq: Four times a day (QID) | ORAL | Status: DC | PRN

## 2013-09-08 MED ORDER — CIPROFLOXACIN HCL 500 MG PO TABS: 500.0000 mg | ORAL_TABLET | Freq: Two times a day (BID) | ORAL | Status: DC

## 2013-09-08 MED ORDER — MIDAZOLAM HCL 5 MG/5ML IJ SOLN
INTRAMUSCULAR | Status: DC | PRN
  Administered 2013-09-08: 2 mg via INTRAVENOUS

## 2013-09-08 MED ORDER — FLEET ENEMA 7-19 GM/118ML RE ENEM
1.0000 | ENEMA | Freq: Once | RECTAL | Status: DC
  Filled 2013-09-08: qty 1

## 2013-09-08 MED ORDER — LIDOCAINE HCL (CARDIAC) 20 MG/ML IV SOLN
INTRAVENOUS | Status: DC | PRN
  Administered 2013-09-08: 80 mg via INTRAVENOUS

## 2013-09-08 MED ORDER — HYDROMORPHONE HCL PF 1 MG/ML IJ SOLN
0.2500 mg | INTRAMUSCULAR | Status: DC | PRN
  Filled 2013-09-08: qty 1

## 2013-09-08 MED ORDER — LACTATED RINGERS IV SOLN
INTRAVENOUS | Status: DC
  Administered 2013-09-08 (×2): via INTRAVENOUS
  Filled 2013-09-08: qty 1000

## 2013-09-08 MED ORDER — STERILE WATER FOR IRRIGATION IR SOLN
Status: DC | PRN
  Administered 2013-09-08: 3000 mL

## 2013-09-08 MED ORDER — KETOROLAC TROMETHAMINE 30 MG/ML IJ SOLN
15.0000 mg | Freq: Once | INTRAMUSCULAR | Status: DC | PRN
  Filled 2013-09-08: qty 1

## 2013-09-08 MED ORDER — IOHEXOL 350 MG/ML SOLN
INTRAVENOUS | Status: DC | PRN
  Administered 2013-09-08: 7 mL

## 2013-09-08 MED ORDER — DEXAMETHASONE SODIUM PHOSPHATE 4 MG/ML IJ SOLN
INTRAMUSCULAR | Status: DC | PRN
  Administered 2013-09-08: 10 mg via INTRAVENOUS

## 2013-09-08 MED ORDER — ONDANSETRON HCL 4 MG/2ML IJ SOLN
INTRAMUSCULAR | Status: DC | PRN
  Administered 2013-09-08: 4 mg via INTRAMUSCULAR

## 2013-09-08 MED ORDER — HYDROCODONE-ACETAMINOPHEN 10-325 MG PO TABS
1.0000 | ORAL_TABLET | Freq: Four times a day (QID) | ORAL | Status: DC | PRN
  Administered 2013-09-08: 1 via ORAL
  Filled 2013-09-08: qty 1

## 2013-09-08 MED ORDER — CIPROFLOXACIN IN D5W 400 MG/200ML IV SOLN
400.0000 mg | INTRAVENOUS | Status: AC
  Administered 2013-09-08: 400 mg via INTRAVENOUS
  Filled 2013-09-08: qty 200

## 2013-09-08 MED ORDER — PROMETHAZINE HCL 25 MG/ML IJ SOLN
6.2500 mg | INTRAMUSCULAR | Status: DC | PRN
  Filled 2013-09-08: qty 1

## 2013-09-08 MED ORDER — FENTANYL CITRATE 0.05 MG/ML IJ SOLN
INTRAMUSCULAR | Status: DC | PRN
  Administered 2013-09-08: 25 ug via INTRAVENOUS
  Administered 2013-09-08 (×2): 50 ug via INTRAVENOUS
  Administered 2013-09-08: 25 ug via INTRAVENOUS
  Administered 2013-09-08: 50 ug via INTRAVENOUS

## 2013-09-08 MED ORDER — ACETAMINOPHEN 10 MG/ML IV SOLN
INTRAVENOUS | Status: DC | PRN
  Administered 2013-09-08: 1000 mg via INTRAVENOUS

## 2013-09-08 MED ORDER — STERILE WATER FOR IRRIGATION IR SOLN
Status: DC | PRN
  Administered 2013-09-08: 3 mL

## 2013-09-08 SURGICAL SUPPLY — 28 items
BAG URINE DRAINAGE (UROLOGICAL SUPPLIES) ×2 IMPLANT
BLADE SURG ROTATE 9660 (MISCELLANEOUS) ×2 IMPLANT
CATH FOLEY 2WAY SLVR  5CC 16FR (CATHETERS) ×2
CATH FOLEY 2WAY SLVR 5CC 16FR (CATHETERS) ×2 IMPLANT
CATH ROBINSON RED A/P 20FR (CATHETERS) ×2 IMPLANT
CLOTH BEACON ORANGE TIMEOUT ST (SAFETY) ×2 IMPLANT
COVER MAYO STAND STRL (DRAPES) ×1 IMPLANT
COVER TABLE BACK 60X90 (DRAPES) ×2 IMPLANT
DRSG TEGADERM 4X4.75 (GAUZE/BANDAGES/DRESSINGS) ×2 IMPLANT
DRSG TEGADERM 8X12 (GAUZE/BANDAGES/DRESSINGS) ×2 IMPLANT
GAUZE SPONGE 4X4 12PLY STRL LF (GAUZE/BANDAGES/DRESSINGS) ×1 IMPLANT
GLOVE BIO SURGEON STRL SZ7 (GLOVE) ×1 IMPLANT
GLOVE BIO SURGEON STRL SZ7.5 (GLOVE) IMPLANT
GLOVE BIO SURGEON STRL SZ8 (GLOVE) ×4 IMPLANT
GLOVE ECLIPSE 8.0 STRL XLNG CF (GLOVE) ×4 IMPLANT
GLOVE INDICATOR 7.5 STRL GRN (GLOVE) ×3 IMPLANT
GOWN STRL NON-REIN LRG LVL3 (GOWN DISPOSABLE) ×1 IMPLANT
GOWN STRL REIN XL XLG (GOWN DISPOSABLE) ×2 IMPLANT
GOWN XL W/COTTON TOWEL STD (GOWNS) ×1 IMPLANT
HOLDER FOLEY CATH W/STRAP (MISCELLANEOUS) ×2 IMPLANT
IV NS IRRIG 3000ML ARTHROMATIC (IV SOLUTION) IMPLANT
IV WATER IRR. 1000ML (IV SOLUTION) ×1 IMPLANT
PACK CYSTOSCOPY (CUSTOM PROCEDURE TRAY) ×2 IMPLANT
Radioactive seed ×94 IMPLANT
SYRINGE 10CC LL (SYRINGE) ×2 IMPLANT
UNDERPAD 30X30 INCONTINENT (UNDERPADS AND DIAPERS) ×4 IMPLANT
WATER STERILE IRR 3000ML UROMA (IV SOLUTION) ×1 IMPLANT
WATER STERILE IRR 500ML POUR (IV SOLUTION) ×2 IMPLANT

## 2013-09-08 NOTE — Anesthesia Postprocedure Evaluation (Signed)
  Anesthesia Post-op Note  Patient: Alan Shah  Procedure(s) Performed: Procedure(s) (LRB): RADIOACTIVE SEED IMPLANT (N/A)  Patient Location: PACU  Anesthesia Type: General  Level of Consciousness: awake and alert   Airway and Oxygen Therapy: Patient Spontanous Breathing  Post-op Pain: mild  Post-op Assessment: Post-op Vital signs reviewed, Patient's Cardiovascular Status Stable, Respiratory Function Stable, Patent Airway and No signs of Nausea or vomiting  Last Vitals:  Filed Vitals:   09/08/13 1148  BP: 143/87  Pulse: 66  Temp:   Resp: 12    Post-op Vital Signs: stable   Complications: No apparent anesthesia complications

## 2013-09-08 NOTE — Procedures (Signed)
  Radiation Oncology         [phone] ________________________________  Name: Alan Shah MRN: [id number]  Date: 09/08/2013  DOB: June 12, 1951       Prostate Seed Implant  [id number] C, MD  No ref. provider found  DIAGNOSIS: 62 y.o. gentleman with stage T1c adenocarcinoma of the prostate with a Gleason's score of 3+4 and a PSA of 8.2  PROCEDURE: Insertion of radioactive I-125 seeds into the prostate gland.  RADIATION DOSE: 145 Gy, definitive therapy.  TECHNIQUE: Alan Shah was brought to the operating room with the urologist. He was placed in the dorsolithotomy position. He was catheterized and a rectal tube was inserted. The perineum was shaved, prepped and draped. The ultrasound probe was then introduced into the rectum to see the prostate gland.  TREATMENT DEVICE: A needle grid was attached to the ultrasound probe stand and anchor needles were placed.  3D PLANNING: The prostate was imaged in 3D using a sagittal sweep of the prostate probe. These images were transferred to the planning computer. There, the prostate, urethra and rectum were defined on each axial reconstructed image. Then, the software created an optimized plan and a few seed positions were adjusted. The quality of the plan was evaluated in terms of dose volume histograms and isodose reconstructions.  Then the accepted plan was uploaded to the seed Selectron afterloading unit.  SPECIAL TREATMENT PROCEDURE/SUPERVISION AND HANDLING: The Nucletron FIRST system was used to place the needles under sagittal guidance. A total of 24 needles were used to deposit 94 seeds in the prostate gland. The individual seed activity was 0.393 mCi for a total implant activity of 36.942 mCi.  COMPLEX SIMULATION: At the end of the procedure, an anterior radiograph of the pelvis was obtained to document seed positioning and count. Cystoscopy was performed to check the urethra and bladder.  MICRODOSIMETRY: At the end of the procedure, the  patient was emitting 0.12 mrem/hr at 1 meter. Accordingly, he was considered safe for hospital discharge.  PLAN: The patient will return to the radiation oncology clinic for post implant CT dosimetry in three weeks.   ________________________________  Sheral Apley Tammi Klippel, M.D.

## 2013-09-08 NOTE — Anesthesia Procedure Notes (Signed)
Procedure Name: LMA Insertion Date/Time: 09/08/2013 10:20 AM Performed by: Bethena Roys T Pre-anesthesia Checklist: Patient identified, Emergency Drugs available, Suction available and Patient being monitored Patient Re-evaluated:Patient Re-evaluated prior to inductionOxygen Delivery Method: Circle System Utilized Preoxygenation: Pre-oxygenation with 100% oxygen Intubation Type: IV induction Ventilation: Mask ventilation without difficulty LMA: LMA inserted LMA Size: 5.0 Number of attempts: 1 Airway Equipment and Method: bite block Placement Confirmation: positive ETCO2 Dental Injury: Teeth and Oropharynx as per pre-operative assessment

## 2013-09-08 NOTE — Transfer of Care (Signed)
Immediate Anesthesia Transfer of Care Note  Patient: Alan Shah  Procedure(s) Performed: Procedure(s): RADIOACTIVE SEED IMPLANT (N/A)  Patient Location: PACU  Anesthesia Type:General  Level of Consciousness: awake and oriented  Airway & Oxygen Therapy: Patient Spontanous Breathing and Patient connected to nasal cannula oxygen  Post-op Assessment: Report given to PACU RN  Post vital signs: Reviewed and stable  Complications: No apparent anesthesia complications

## 2013-09-08 NOTE — Interval H&P Note (Signed)
History and Physical Interval Note:  09/08/2013 10:15 AM  Alan Shah  has presented today for surgery, with the diagnosis of PROSTATE CANCER  The various methods of treatment have been discussed with the patient and family. After consideration of risks, benefits and other options for treatment, the patient has consented to  Procedure(s): RADIOACTIVE SEED IMPLANT (N/A) as a surgical intervention .  The patient's history has been reviewed, patient examined, no change in status, stable for surgery.  I have reviewed the patient's chart and labs.  Questions were answered to the patient's satisfaction.     Claybon Jabs

## 2013-09-08 NOTE — Op Note (Signed)
PATIENT:  Alan Shah  PRE-OPERATIVE DIAGNOSIS:  Adenocarcinoma of the prostate  POST-OPERATIVE DIAGNOSIS:  Same  PROCEDURE:  Procedure(s): 1. I-125 radioactive seed implantation 2. Cystoscopy  SURGEON:  Surgeon(s): Claybon Jabs  Radiation oncologist: Dr. Tyler Pita  ANESTHESIA:  General  EBL:  Minimal  DRAINS: 27 French Foley catheter  INDICATION: Alan Shah is a 62 year old male patient with biopsy-proven adenocarcinoma of the prostate. He has elected to proceed with radioactive seed implantation.  Description of procedure: After informed consent the patient was brought to the major OR, placed on the table and administered general anesthesia. He was then moved to the modified lithotomy position with his perineum perpendicular to the floor. His perineum and genitalia were then sterilely prepped. An official timeout was then performed. A 16 French Foley catheter was then placed in the bladder and filled with dilute contrast, a rectal tube was placed in the rectum and the transrectal ultrasound probe was placed in the rectum and affixed to the stand. He was then sterilely draped.  Real time ultrasonography was used along with the seed planning software spot-pro version 3.1-00. This was used to develop the seed plan including the number of needles as well as number of seeds required for complete and adequate coverage. Real-time ultrasonography was then used along with the previously developed plan and the Nucletron device to implant a total of 94 seeds using 22 needles. This proceeded without difficulty or complication.  A Foley catheter was then removed as well as the transrectal ultrasound probe and rectal probe. Flexible cystoscopy was then performed using the 17 French flexible scope which revealed a normal urethra throughout its length down to the sphincter which appeared intact. The prostatic urethra revealed bilobar hypertrophy but no evidence of obstruction, seeds, spacers  or lesions. The bladder was then entered and fully and systematically inspected. The ureteral orifices were noted to be of normal configuration and position. The mucosa revealed no evidence of tumors. There were also no stones identified within the bladder. I noted no seeds or spacers on the floor of the bladder and retroflexion of the scope revealed no seeds protruding from the base of the prostate.  The cystoscope was then removed and a new 37 French Foley catheter was then inserted and the balloon was filled with 10 cc of sterile water. This was connected to closed system drainage and the patient was awakened and taken to recovery room in stable and satisfactory condition. He tolerated procedure well and there were no intraoperative complications.

## 2013-09-08 NOTE — Anesthesia Preprocedure Evaluation (Addendum)
Anesthesia Evaluation  Patient identified by MRN, date of birth, ID band Patient awake    Reviewed: Allergy & Precautions, H&P , NPO status , Patient's Chart, lab work & pertinent test results  Airway Mallampati: II TM Distance: >3 FB Neck ROM: Full    Dental no notable dental hx.    Pulmonary neg pulmonary ROS,  breath sounds clear to auscultation  Pulmonary exam normal       Cardiovascular hypertension, Pt. on medications Rhythm:Regular Rate:Normal     Neuro/Psych negative neurological ROS  negative psych ROS   GI/Hepatic negative GI ROS, Neg liver ROS,   Endo/Other  negative endocrine ROS  Renal/GU negative Renal ROS  negative genitourinary   Musculoskeletal negative musculoskeletal ROS (+)   Abdominal   Peds negative pediatric ROS (+)  Hematology negative hematology ROS (+)   Anesthesia Other Findings   Reproductive/Obstetrics negative OB ROS                           Anesthesia Physical Anesthesia Plan  ASA: II  Anesthesia Plan: General   Post-op Pain Management:    Induction: Intravenous  Airway Management Planned: LMA  Additional Equipment:   Intra-op Plan:   Post-operative Plan:   Informed Consent: I have reviewed the patients History and Physical, chart, labs and discussed the procedure including the risks, benefits and alternatives for the proposed anesthesia with the patient or authorized representative who has indicated his/her understanding and acceptance.   Dental advisory given  Plan Discussed with: CRNA and Surgeon  Anesthesia Plan Comments:         Anesthesia Quick Evaluation

## 2013-09-09 ENCOUNTER — Encounter (HOSPITAL_BASED_OUTPATIENT_CLINIC_OR_DEPARTMENT_OTHER): Payer: Self-pay | Admitting: Urology

## 2013-09-28 ENCOUNTER — Encounter: Payer: Self-pay | Admitting: Family Medicine

## 2013-10-06 ENCOUNTER — Other Ambulatory Visit: Payer: Self-pay

## 2013-10-20 ENCOUNTER — Telehealth: Payer: Self-pay | Admitting: *Deleted

## 2013-10-20 NOTE — Telephone Encounter (Signed)
CALLED PATIENT TO REMIND OF APPTS. FOR 10-21-13, CONFIRMED APPTS. WITH PATIENT.

## 2013-10-21 ENCOUNTER — Ambulatory Visit
Admission: RE | Admit: 2013-10-21 | Discharge: 2013-10-21 | Disposition: A | Discharge status: Home / Self Care | Payer: BC Managed Care – PPO | Source: Ambulatory Visit | Attending: Radiation Oncology | Admitting: Radiation Oncology

## 2013-10-21 ENCOUNTER — Encounter: Payer: Self-pay | Admitting: Radiation Oncology

## 2013-10-21 VITALS — BP 136/77 | HR 87 | Temp 98.0°F | Resp 16 | Wt 185.0 lb

## 2013-10-21 DIAGNOSIS — C61 Malignant neoplasm of prostate: Secondary | ICD-10-CM

## 2013-10-21 DIAGNOSIS — N400 Enlarged prostate without lower urinary tract symptoms: Secondary | ICD-10-CM

## 2013-10-21 NOTE — Progress Notes (Signed)
Radiation Oncology         [phone] ________________________________  Name: Alan Shah MRN: [id number]  Date: 10/21/2013  DOB: [birth year]  Follow-Up Visit Note  CC: Claybon Jabs, MD  Claybon Jabs, MD  Diagnosis:   62 y.o. gentleman with stage T1c adenocarcinoma of the prostate with a Gleason's score of 3+4 and a PSA of 8.2  Interval Since Last Radiation:  3  weeks  Narrative:  The patient returns today for routine follow-up.  He is complaining of increased urinary frequency and urinary hesitation symptoms. He filled out a questionnaire regarding urinary function today providing and overall IPSS score of 3 characterizing his symptoms as mild.  His pre-implant score was 2. He denies any bowel symptoms.  ALLERGIES:  has No Known Allergies.  Meds: Current Outpatient Prescriptions  Medication Sig Dispense Refill  . aspirin 81 MG tablet Take 81 mg by mouth daily.      . hydrochlorothiazide (HYDRODIURIL) 25 MG tablet Take 1 tablet (25 mg total) by mouth daily.  90 tablet  3  . Multiple Vitamin (MULTIVITAMIN) tablet Take 1 tablet by mouth daily.      . pravastatin (PRAVACHOL) 40 MG tablet Take 40 mg by mouth every morning.      . verapamil (COVERA HS) 180 MG (CO) 24 hr tablet Take 1 tablet (180 mg total) by mouth at bedtime.  90 tablet  3  . HYDROcodone-acetaminophen (NORCO) 10-325 MG per tablet Take 1 tablet by mouth every 6 (six) hours as needed for pain.  30 tablet  0   No current facility-administered medications for this encounter.    Physical Findings: The patient is in no acute distress. Patient is alert and oriented.  weight is 185 lb (83.915 kg). His oral temperature is 98 F (36.7 C). His blood pressure is 136/77 and his pulse is 87. His respiration is 16 and oxygen saturation is 100%. .  No significant changes.  Lab Findings: Lab Results  Component Value Date   WBC 4.1 08/26/2013   HGB 12.3* 08/26/2013   HCT 37.0* 08/26/2013   MCV 83.0 08/26/2013   PLT 182  08/26/2013    Radiographic Findings:  Patient underwent CT imaging in our clinic for post implant dosimetry. The CT appears to demonstrate an adequate distribution of radioactive seeds throughout the prostate gland. There no seeds in her near the rectum. I suspect the final radiation plan and dosimetry will show appropriate coverage of the prostate gland.   Impression: The patient is recovering from the effects of radiation. His urinary symptoms should gradually improve over the next 4-6 months. We talked about this today. He is encouraged by his improvement already and is otherwise please with his outcome.   Plan: Today, I spent time talking to the patient about his prostate seed implant and resolving urinary symptoms. We also talked about long-term follow-up for prostate cancer following seed implant. He understands that ongoing PSA determinations and digital rectal exams will help perform surveillance to rule out disease recurrence. He understands what to expect with his PSA measures. Patient was also educated today about some of the long-term effects from radiation including a small risk for rectal bleeding and possibly erectile dysfunction. We talked about some of the general management approaches to these potential complications. However, I did encourage the patient to contact our office or return at any point if he has questions or concerns related to his previous radiation and prostate cancer.  _____________________________________  Sheral Apley. Tammi Klippel, M.D.

## 2013-10-21 NOTE — Progress Notes (Signed)
Here today for post seed follow up with Dr. Tammi Klippel. Denies pain at this time. Reports on average he gets up once during the night to void. Reports on average he voids during the day every hour to hour and a half depending on his fluid intake. Denies that urinary frequency is an issue. Denies burning with urination. Denies hematuria. Denies diarrhea. Describes urine stream is not weak but, not as strong as it once was. Denies urinary or bowel incontinence. Denies pain associated with bowel movements or blood in stool.

## 2013-10-21 NOTE — Progress Notes (Signed)
  Radiation Oncology         [phone] ________________________________  Name: Alan Shah  MRN: [id number]  Date: 10/21/2013  DOB: 05-15-1951  COMPLEX SIMULATION NOTE  NARRATIVE:  The patient was brought to the [facility] today following prostate seed implantation approximately one month ago.  Identity was confirmed.  All relevant records and images related to the planned course of therapy were reviewed.  Then, the patient was set-up supine.  CT images were obtained.  The CT images were loaded into the planning software.  Then the prostate and rectum were contoured.  Treatment planning then occurred.  The implanted iodine 125 seeds were identified by the physics staff for projection of radiation distribution  I have requested : 3D Simulation  I have requested a DVH of the following structures: Prostate and rectum.    ________________________________  Sheral Apley Tammi Klippel, M.D.

## 2013-11-14 ENCOUNTER — Ambulatory Visit: Payer: BC Managed Care – PPO | Attending: Radiation Oncology

## 2013-11-14 ENCOUNTER — Encounter: Payer: Self-pay | Admitting: Radiation Oncology

## 2013-11-14 DIAGNOSIS — C61 Malignant neoplasm of prostate: Secondary | ICD-10-CM | POA: Insufficient documentation

## 2013-11-20 NOTE — Progress Notes (Signed)
  Radiation Oncology         [phone] ________________________________  Name: ADEL ALLEMANG  MRN: [id number]  Date: 11/14/2013  DOB: 15-Aug-1951  3-D Planning Note Prostate Brachytherapy  Diagnosis: 62 y.o. gentleman with stage T1c adenocarcinoma of the prostate with a Gleason's score of 3+4 and a PSA of 8.2  Narrative: On a previous date, DAEQUON ADRIANCE returned following prostate seed implantation for post implant planning. He underwent CT scan complex simulation to delineate the three-dimensional structures of the pelvis and demonstrate the radiation distribution.  Since that time, the seed localization, and 3D planning with dose volume histograms have now been completed.  Results:   Prostate Coverage - The dose of radiation delivered to the 90% or more of the prostate gland (D90) was 104.33% of the prescription dose. This exceeds our goal of greater than 90%. Rectal Sparing - The volume of rectal tissue receiving the prescription dose or higher was 1.71 cc. This falls over our threshold tolerance of 1.0 cc.  Impression: The prostate seed implant appears to show adequate target coverage and appropriate rectal sparing.  Plan:  The patient will continue to follow with urology for ongoing PSA determinations. I would anticipate a high likelihood for local tumor control with some modest risk for rectal morbidity.  ________________________________  Sheral Apley Tammi Klippel, M.D.

## 2013-12-19 IMAGING — CT CT HEAD W/O CM
4 of 6 series · 16 of 40 positions shown, 18 images · non-contrast
Comparison: None.

CT HEAD

CLINICAL DATA: Fall, head injury

CT HEAD WITHOUT CONTRAST
CT CERVICAL SPINE WITHOUT CONTRAST
TECHNIQUE: Multidetector CT imaging of the head and cervical spine
was performed following the standard protocol without intravenous
contrast.  Multiplanar CT image reconstructions of the cervical
spine were also generated.

[Series 2: brain · axial · 0.47mm/px · z∈[-68,-22]mm · 2 of 28 slices shown]
[im 10/28  brain]
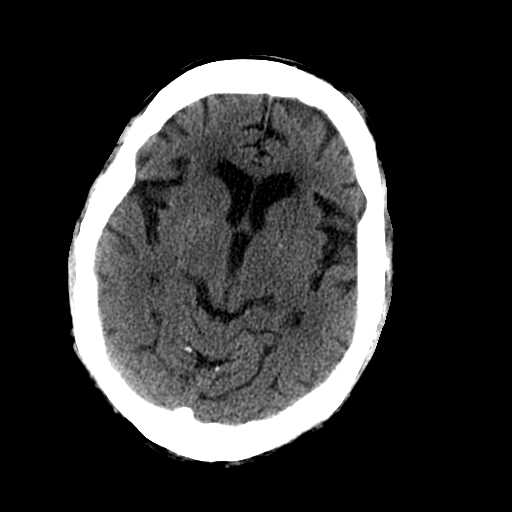
[im 19/28  brain]
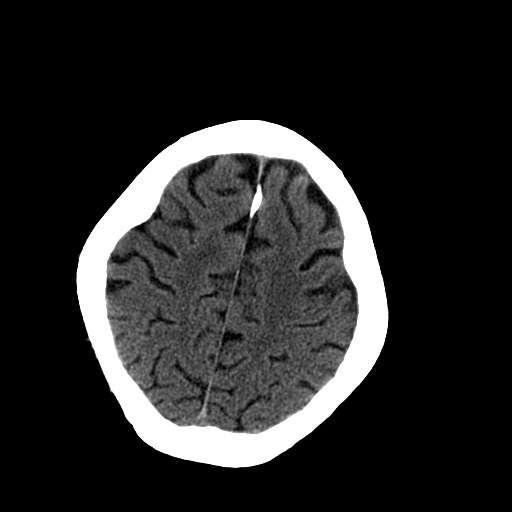

[Series 3: recon 2: brain · axial · 0.47mm/px · z∈[-88,-3]mm · 4 of 56 slices shown]
[im 12/56  brain]
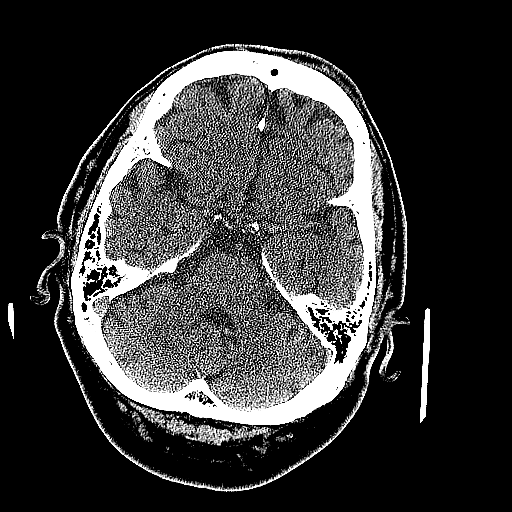
[im 23/56  brain]
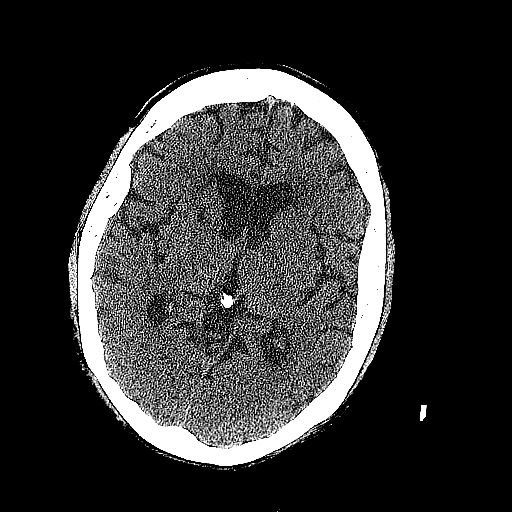
[im 34/56  brain]
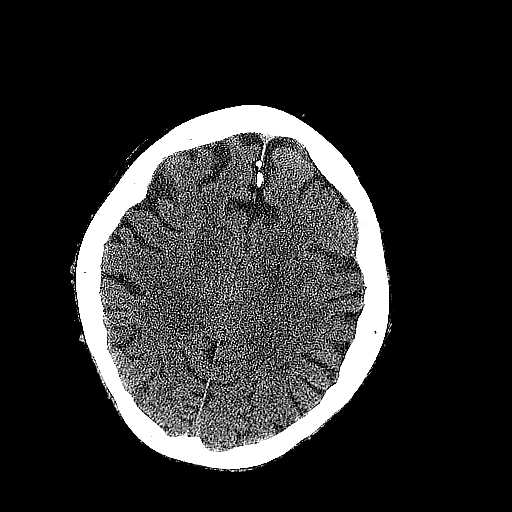
[im 45/56  brain]
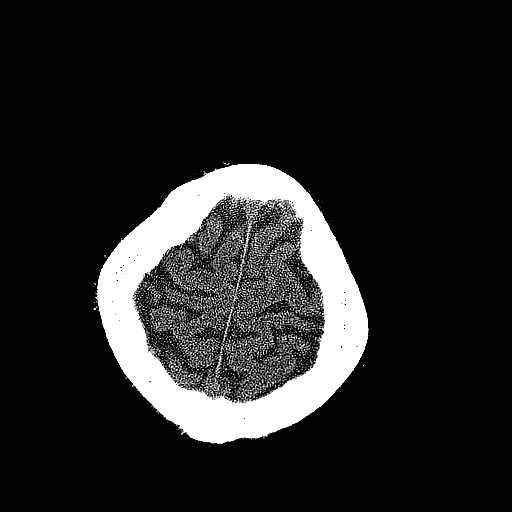

[Series 601: coronals · coronal · 0.41mm/px · 3 of 46 slices shown]
[im 16/46  brain]
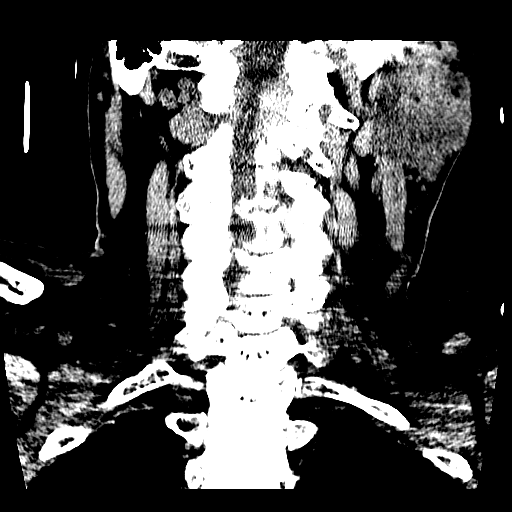
[im 21/46  brain]
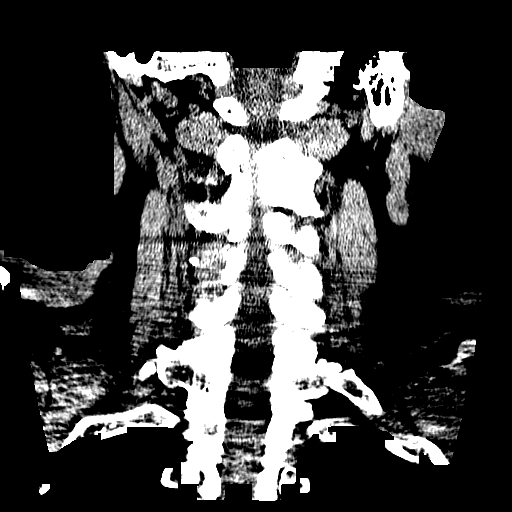
[im 26/46  brain]
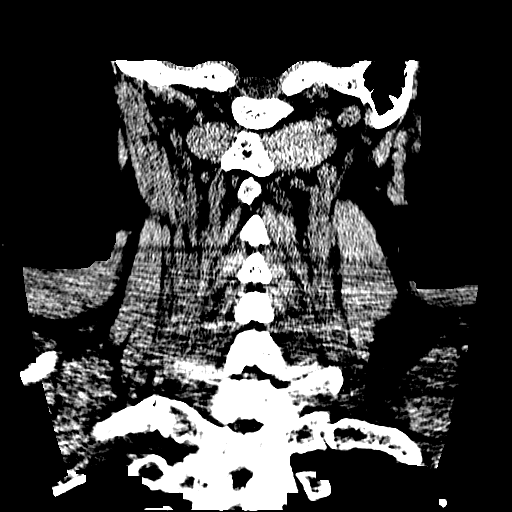

[Series 602: orthog · axial · 0.41mm/px · z∈[-327,-225]mm · 7 of 82 slices shown, 9 images]
[im 11/82  brain]
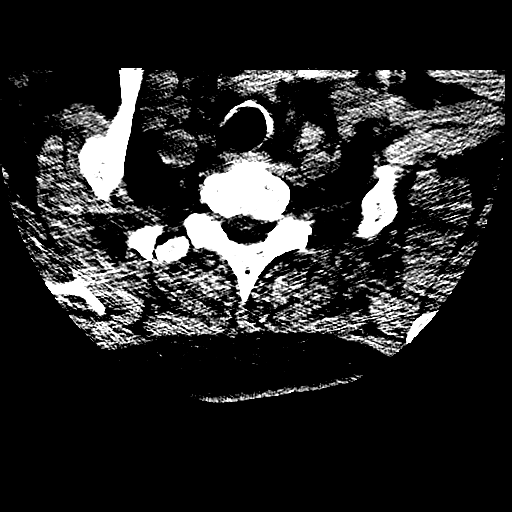
[im 11/82  bone]
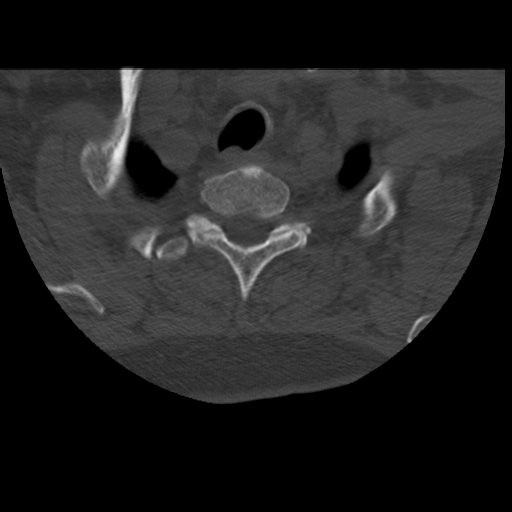
[im 21/82  brain]
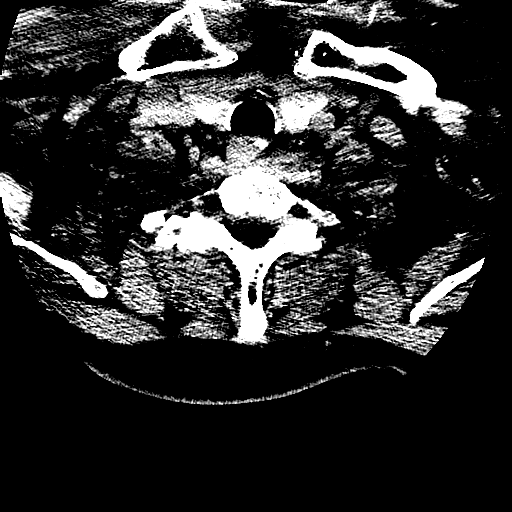
[im 31/82  brain]
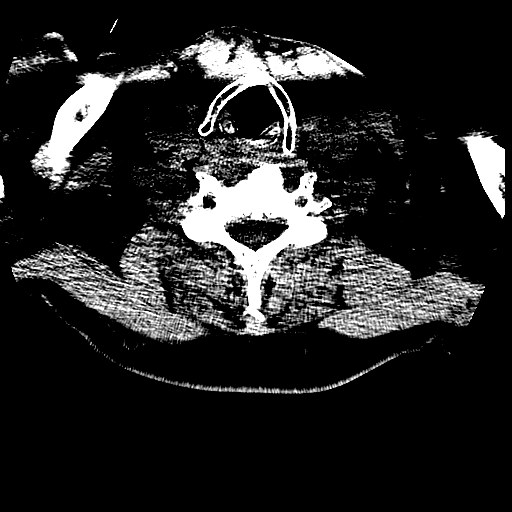
[im 41/82  brain]
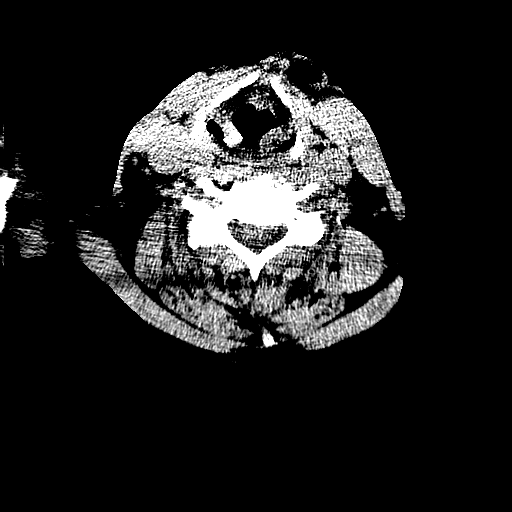
[im 51/82  brain]
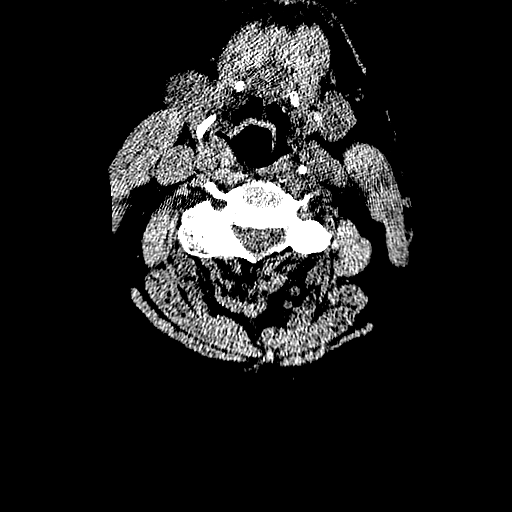
[im 51/82  bone]
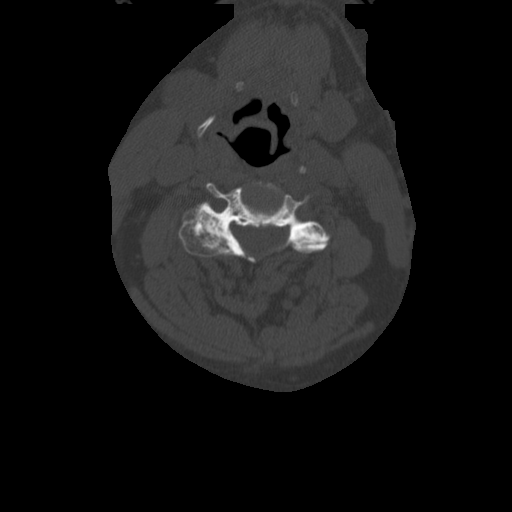
[im 61/82  brain]
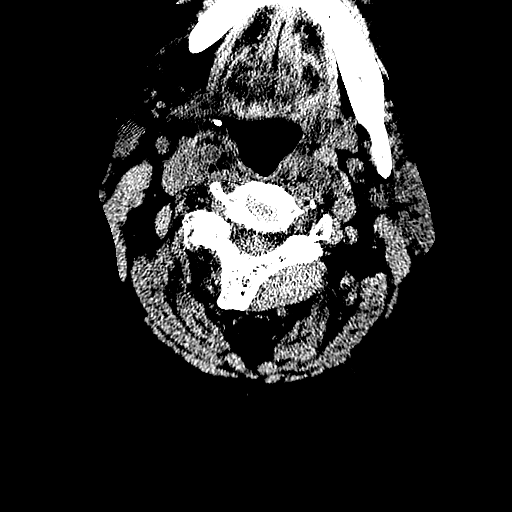
[im 71/82  brain]
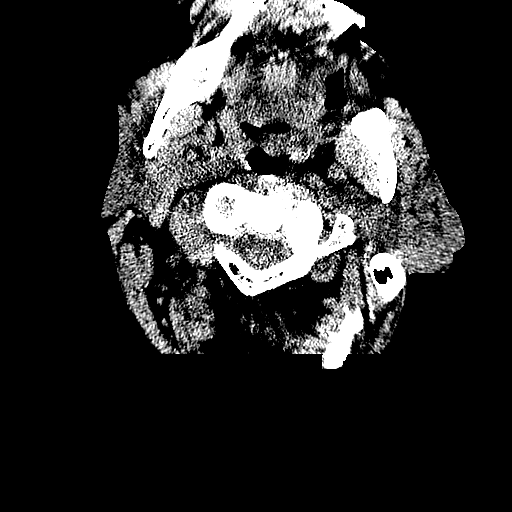

[16 of 40 positions shown; findings below may reference images not displayed]

FINDINGS: No evidence of parenchymal hemorrhage or extra-axial
fluid collection. No mass lesion, mass effect, or midline shift.

No CT evidence of acute infarction.

Subcortical white matter and periventricular small vessel ischemic
changes.  Right basal ganglia lacunar infarct.

Global cortical atrophy.  Secondary ventricular prominence.

The visualized paranasal sinuses are essentially clear. The mastoid
air cells are unopacified.

No evidence of calvarial fracture.
IMPRESSION: No evidence of acute intracranial abnormality.

Right basal ganglia lacunar infarct.

Atrophy with small vessel ischemic changes.

CT CERVICAL SPINE
FINDINGS: Straightening of the cervical spine, positive positional.

No evidence of fracture or dislocation.  Vertebral body heights are
maintained.  The dens appears intact.

No prevertebral soft tissue swelling.

Moderate multilevel degenerative changes.

Visualized thyroid is unremarkable.

Visualized lung apices are essentially clear.
IMPRESSION: No evidence of traumatic injury to the cervical spine.

Moderate multilevel degenerative changes.

## 2014-01-04 ENCOUNTER — Other Ambulatory Visit: Payer: Self-pay | Admitting: Family Medicine

## 2014-01-04 ENCOUNTER — Encounter: Payer: Self-pay | Admitting: Family Medicine

## 2014-01-04 ENCOUNTER — Ambulatory Visit (INDEPENDENT_AMBULATORY_CARE_PROVIDER_SITE_OTHER): Payer: BC Managed Care – PPO | Admitting: Family Medicine

## 2014-01-04 VITALS — BP 126/76 | HR 72 | Temp 99.3°F | Resp 16 | Ht 68.5 in | Wt 192.0 lb

## 2014-01-04 DIAGNOSIS — Z0000 Encounter for general adult medical examination without abnormal findings: Secondary | ICD-10-CM

## 2014-01-04 DIAGNOSIS — D649 Anemia, unspecified: Secondary | ICD-10-CM

## 2014-01-04 DIAGNOSIS — I10 Essential (primary) hypertension: Secondary | ICD-10-CM

## 2014-01-04 DIAGNOSIS — E785 Hyperlipidemia, unspecified: Secondary | ICD-10-CM

## 2014-01-04 LAB — CBC WITH DIFFERENTIAL/PLATELET
Basophils Absolute: 0 10*3/uL (ref 0.0–0.1)
Basophils Relative: 1 % (ref 0–1)
Eosinophils Absolute: 0.1 10*3/uL (ref 0.0–0.7)
Eosinophils Relative: 3 % (ref 0–5)
HCT: 39 % (ref 39.0–52.0)
Hemoglobin: 13.4 g/dL (ref 13.0–17.0)
Lymphocytes Relative: 26 % (ref 12–46)
Lymphs Abs: 0.9 10*3/uL (ref 0.7–4.0)
MCH: 29.5 pg (ref 26.0–34.0)
MCHC: 34.4 g/dL (ref 30.0–36.0)
MCV: 85.9 fL (ref 78.0–100.0)
Monocytes Absolute: 0.3 10*3/uL (ref 0.1–1.0)
Monocytes Relative: 7 % (ref 3–12)
Neutro Abs: 2.2 10*3/uL (ref 1.7–7.7)
Neutrophils Relative %: 63 % (ref 43–77)
Platelets: 207 10*3/uL (ref 150–400)
RBC: 4.54 MIL/uL (ref 4.22–5.81)
RDW: 16.1 % — ABNORMAL HIGH (ref 11.5–15.5)
WBC: 3.5 10*3/uL — ABNORMAL LOW (ref 4.0–10.5)

## 2014-01-04 LAB — POCT URINALYSIS DIPSTICK
Bilirubin, UA: NEGATIVE
Blood, UA: NEGATIVE
Glucose, UA: NEGATIVE
Ketones, UA: NEGATIVE
Leukocytes, UA: NEGATIVE
Nitrite, UA: NEGATIVE
Protein, UA: 30
Spec Grav, UA: 1.02
Urobilinogen, UA: 1
pH, UA: 7

## 2014-01-04 LAB — BASIC METABOLIC PANEL
BUN: 21 mg/dL (ref 6–23)
CO2: 27 mEq/L (ref 19–32)
Calcium: 10.2 mg/dL (ref 8.4–10.5)
Chloride: 101 mEq/L (ref 96–112)
Creat: 1.36 mg/dL — ABNORMAL HIGH (ref 0.50–1.35)
Glucose, Bld: 99 mg/dL (ref 70–99)
Potassium: 3.7 mEq/L (ref 3.5–5.3)
Sodium: 139 mEq/L (ref 135–145)

## 2014-01-04 LAB — T4, FREE: Free T4: 1.24 ng/dL (ref 0.80–1.80)

## 2014-01-04 LAB — LIPID PANEL
Cholesterol: 155 mg/dL (ref 0–200)
HDL: 71 mg/dL (ref >39)
LDL Cholesterol: 74 mg/dL (ref 0–99)
Total CHOL/HDL Ratio: 2.2 Ratio
Triglycerides: 51 mg/dL (ref <150)
VLDL: 10 mg/dL (ref 0–40)

## 2014-01-04 LAB — HEPATITIS C ANTIBODY: HCV Ab: NEGATIVE

## 2014-01-04 LAB — TSH: TSH: 1.135 u[IU]/mL (ref 0.350–4.500)

## 2014-01-04 LAB — ALT: ALT: 18 U/L (ref 0–53)

## 2014-01-04 MED ORDER — HYDROCHLOROTHIAZIDE 25 MG PO TABS: 25.0000 mg | ORAL_TABLET | Freq: Every day | ORAL | Status: DC

## 2014-01-04 MED ORDER — VERAPAMIL HCL 180 MG (CO) PO TB24: 180.0000 mg | ORAL_TABLET | Freq: Every day | ORAL | Status: DC

## 2014-01-04 MED ORDER — PRAVASTATIN SODIUM 40 MG PO TABS: 40.0000 mg | ORAL_TABLET | Freq: Every morning | ORAL | Status: DC

## 2014-01-04 NOTE — Patient Instructions (Signed)

## 2014-01-04 NOTE — Progress Notes (Signed)
Subjective:    Patient ID: Alan Shah, male    DOB: 07-05-1951, 63 y.o.   MRN: [id number]  HPI  This 63 y.o. AA male is here for CPE. HE has well controlled HTN and lipid disorder. Pt is in treatment w/ [facility] for prostate cancer treatment. He is doing well w/o significant adverse effects. Pt is retired since age 78 from [employer] and [employer].  HCM: CRS- Current (polyps).            PSA- As above (followed by Urology).            IMM- Current.            Vision- Every 1-2 years.  Patient Active Problem List   Diagnosis Date Noted  . Stage T1c Prostate Cancer, Gleasons 3+4, PSA 8.2  s/p Prostate Seed Implant 09/08/13 10/21/2013  . Hypercholesteremia 01/10/2013  . Unspecified vitamin D deficiency 01/10/2013  . Hypertrophy of prostate without urinary obstruction and other lower urinary tract symptoms (LUTS) 01/10/2013  . HTN (hypertension) 06/01/2012  . Overweight (BMI 25.0-29.9) 06/01/2012    Prior to Admission medications   Medication Sig Start Date End Date Taking? Authorizing Provider  aspirin 81 MG tablet Take 81 mg by mouth daily.   Yes Historical Provider, MD  hydrochlorothiazide (HYDRODIURIL) 25 MG tablet Take 1 tablet (25 mg total) by mouth daily. 01/04/14  Yes Barton Fanny, MD  Multiple Vitamin (MULTIVITAMIN) tablet Take 1 tablet by mouth daily.   Yes Historical Provider, MD  pravastatin (PRAVACHOL) 40 MG tablet Take 1 tablet (40 mg total) by mouth every morning. 01/04/14  Yes Barton Fanny, MD  verapamil (COVERA HS) 180 MG (CO) 24 hr tablet Take 1 tablet (180 mg total) by mouth at bedtime. 01/04/14  Yes Barton Fanny, MD  HYDROcodone-acetaminophen [facility]) 10-325 MG per tablet Take 1 tablet by mouth every 6 (six) hours as needed for pain. 09/08/13   Claybon Jabs, MD   PMHx, Surg Hx, Soc and Fam Hx reviewed.   Review of Systems  Constitutional: Negative.   HENT: Negative.   Eyes: Negative.   Respiratory: Negative.   Cardiovascular: Negative.     Gastrointestinal: Negative.   Endocrine: Negative.   Genitourinary:       Pt has prostate cancer, s/p seed implant Oct 2014; care managed by Dr. Karsten Ro at [facility]  Musculoskeletal: Negative.   Skin: Negative.   Allergic/Immunologic: Negative.   Neurological: Negative.   Hematological: Negative.   Psychiatric/Behavioral: Negative.       Objective:   Physical Exam  Nursing note and vitals reviewed. Constitutional: He is oriented to person, place, and time. Vital signs are normal. He appears well-developed and well-nourished. No distress.  HENT:  Head: Normocephalic and atraumatic.  Right Ear: Hearing, tympanic membrane, external ear and ear canal normal.  Left Ear: Hearing, tympanic membrane, external ear and ear canal normal.  Nose: Nose normal. No nasal deformity or septal deviation.  Mouth/Throat: Uvula is midline, oropharynx is clear and moist and mucous membranes are normal. No oral lesions. Normal dentition. No dental caries.  Eyes: Conjunctivae, EOM and lids are normal. Pupils are equal, round, and reactive to light. No scleral icterus.  Fundoscopic exam:      The right eye shows no papilledema. The right eye shows red reflex.       The left eye shows no papilledema. The left eye shows red reflex.  Neck: Trachea normal, normal range of motion and full  passive range of motion without pain. Neck supple. No JVD present. No spinous process tenderness and no muscular tenderness present. No mass and no thyromegaly present.  Cardiovascular: Normal rate, regular rhythm, S1 normal, S2 normal, normal heart sounds and normal pulses.   No extrasystoles are present. PMI is not displaced.  Exam reveals no gallop and no friction rub.   No murmur heard. Pulmonary/Chest: Effort normal and breath sounds normal. No respiratory distress.  Abdominal: Soft. Normal appearance and normal aorta. He exhibits no distension, no abdominal bruit, no pulsatile midline mass and no mass. There is no  hepatosplenomegaly. There is no tenderness. There is no guarding and no CVA tenderness. No hernia.  Genitourinary:  Deferred to Urology.  Musculoskeletal: Normal range of motion. He exhibits no edema and no tenderness.       Cervical back: Normal.       Thoracic back: Normal.       Lumbar back: Normal.  Lymphadenopathy:       Head (right side): No submental, no submandibular, no tonsillar, no posterior auricular and no occipital adenopathy present.       Head (left side): No submental, no submandibular, no tonsillar, no posterior auricular and no occipital adenopathy present.    He has no cervical adenopathy.    He has no axillary adenopathy.       Right: No inguinal and no supraclavicular adenopathy present.       Left: No inguinal and no supraclavicular adenopathy present.  Neurological: He is alert and oriented to person, place, and time. He has normal strength. He displays no atrophy and no tremor. No cranial nerve deficit or sensory deficit. He exhibits normal muscle tone. He displays a negative Romberg sign. Coordination and gait normal.  Reflex Scores:      Tricep reflexes are 1+ on the right side and 1+ on the left side.      Bicep reflexes are 1+ on the right side and 1+ on the left side.      Patellar reflexes are 2+ on the right side and 2+ on the left side. Skin: Skin is warm, dry and intact. No ecchymosis, no lesion and no rash noted. He is not diaphoretic. No cyanosis or erythema. Nails show no clubbing.  Psychiatric: He has a normal mood and affect. His speech is normal and behavior is normal. Judgment and thought content normal. Cognition and memory are normal.    Results for orders placed in visit on 01/04/14  POCT URINALYSIS DIPSTICK      Result Value Range   Color, UA yellow     Clarity, UA clear     Glucose, UA neg     Bilirubin, UA neg     Ketones, UA neg     Spec Grav, UA 1.020     Blood, UA neg     pH, UA 7.0     Protein, UA 30     Urobilinogen, UA 1.0      Nitrite, UA neg     Leukocytes, UA Negative         Assessment & Plan:  Routine general medical examination at a health care facility - Plan: POCT urinalysis dipstick, TSH, T4, Free, Hepatitis C antibody  HTN (hypertension) - Stable and controlled. Continue same medications.  Plan: hydrochlorothiazide (HYDRODIURIL) 25 MG tablet, Lipid panel, TSH, T4, Free, Basic metabolic panel, CBC with Differential  Anemia -  (Sept 2014) H/H - 12.3/ 37.0   Plan: CBC with Differential  Hyperlipidemia -  Plan: ALT  Meds ordered this encounter  Medications  . hydrochlorothiazide (HYDRODIURIL) 25 MG tablet    Sig: Take 1 tablet (25 mg total) by mouth daily.    Dispense:  90 tablet    Refill:  3  . pravastatin (PRAVACHOL) 40 MG tablet    Sig: Take 1 tablet (40 mg total) by mouth every morning.    Dispense:  90 tablet    Refill:  3  . verapamil (COVERA HS) 180 MG (CO) 24 hr tablet    Sig: Take 1 tablet (180 mg total) by mouth at bedtime.    Dispense:  90 tablet    Refill:  3

## 2014-01-06 ENCOUNTER — Encounter: Payer: Self-pay | Admitting: Family Medicine

## 2014-07-05 ENCOUNTER — Ambulatory Visit (INDEPENDENT_AMBULATORY_CARE_PROVIDER_SITE_OTHER): Payer: BC Managed Care – PPO | Admitting: Family Medicine

## 2014-07-05 ENCOUNTER — Encounter: Payer: Self-pay | Admitting: Family Medicine

## 2014-07-05 VITALS — BP 137/81 | HR 95 | Temp 98.6°F | Resp 16 | Ht 68.5 in | Wt 185.4 lb

## 2014-07-05 DIAGNOSIS — I10 Essential (primary) hypertension: Secondary | ICD-10-CM

## 2014-07-05 DIAGNOSIS — E7800 Pure hypercholesterolemia, unspecified: Secondary | ICD-10-CM

## 2014-07-05 NOTE — Progress Notes (Signed)
S:  This 63 y.o. AA male has HTN and elevated cholesterol; medication compliance is good w/o adverse effects. Pt feels well and denies fatigue, vision disturbances, CP or tightness, palpitations, SOB or cough, edema, HA, numbness, weakness or syncope. No reported myalgias or back pain that might be attributed to statin.  Patient Active Problem List   Diagnosis Date Noted  . Stage T1c Prostate Cancer, Gleasons 3+4, PSA 8.2  s/p Prostate Seed Implant 09/08/13 10/21/2013  . Hypercholesteremia 01/10/2013  . Unspecified vitamin D deficiency 01/10/2013  . Hypertrophy of prostate without urinary obstruction and other lower urinary tract symptoms (LUTS) 01/10/2013  . HTN (hypertension) 06/01/2012  . Overweight (BMI 25.0-29.9) 06/01/2012    Outpatient Encounter Prescriptions as of 07/05/2014  Medication Sig  . aspirin 81 MG tablet Take 81 mg by mouth daily.  . hydrochlorothiazide (HYDRODIURIL) 25 MG tablet Take 1 tablet (25 mg total) by mouth daily.  . Multiple Vitamin (MULTIVITAMIN) tablet Take 1 tablet by mouth daily.  . pravastatin (PRAVACHOL) 40 MG tablet Take 1 tablet (40 mg total) by mouth every morning.  . verapamil (COVERA HS) 180 MG (CO) 24 hr tablet Take 1 tablet (180 mg total) by mouth at bedtime.    No Known Allergies Past Surgical History  Procedure Laterality Date  . Colonoscopy    . Prostate biopsy  02/24/2013  &   06-07-2013  . Orif displaced right two-part proximal humerous fx  03-05-2010  . Right shoulder surger for fx  2010  . Radioactive seed implant N/A 09/08/2013    Procedure: RADIOACTIVE SEED IMPLANT;  Surgeon: Claybon Jabs, MD;  Location: [facility];  Service: Urology;  Laterality: N/A;    O: Filed Vitals:   07/05/14 0801  BP: 137/81  Pulse: 95  Temp: 98.6 F (37 C)  Resp: 16   GEN: In NAD: WN,WD. HENT: [place]/AT; EOMI w/ clear conj/sclerae. Otherwise unremarkable. COR: RRR. LUNGS: Unlabored resp. SKIN: W&D; intact w/o erythema or  diaphoresis. NEURO: A&O x 3; CNs intact. Nonfocal.  A/P: Essential hypertension- Stable and well controlled. No medication change.  Hypercholesteremia- Stable on current statin w/o side effects.  RTC 6 months for CPE/ fasting labs.

## 2014-07-05 NOTE — Patient Instructions (Signed)

## 2015-01-05 ENCOUNTER — Encounter: Payer: Self-pay | Admitting: Family Medicine

## 2015-01-05 ENCOUNTER — Ambulatory Visit (INDEPENDENT_AMBULATORY_CARE_PROVIDER_SITE_OTHER): Payer: BLUE CROSS/BLUE SHIELD | Admitting: Family Medicine

## 2015-01-05 VITALS — BP 114/70 | HR 80 | Temp 98.1°F | Resp 16 | Ht 69.0 in | Wt 190.8 lb

## 2015-01-05 DIAGNOSIS — E7800 Pure hypercholesterolemia, unspecified: Secondary | ICD-10-CM

## 2015-01-05 DIAGNOSIS — Z23 Encounter for immunization: Secondary | ICD-10-CM

## 2015-01-05 DIAGNOSIS — I10 Essential (primary) hypertension: Secondary | ICD-10-CM

## 2015-01-05 DIAGNOSIS — Z0000 Encounter for general adult medical examination without abnormal findings: Secondary | ICD-10-CM

## 2015-01-05 LAB — CBC WITH DIFFERENTIAL/PLATELET
Basophils Absolute: 0 10*3/uL (ref 0.0–0.1)
Basophils Relative: 1 % (ref 0–1)
Eosinophils Absolute: 0.1 10*3/uL (ref 0.0–0.7)
Eosinophils Relative: 3 % (ref 0–5)
HCT: 36.2 % — ABNORMAL LOW (ref 39.0–52.0)
Hemoglobin: 12.2 g/dL — ABNORMAL LOW (ref 13.0–17.0)
Lymphocytes Relative: 26 % (ref 12–46)
Lymphs Abs: 1 10*3/uL (ref 0.7–4.0)
MCH: 29.5 pg (ref 26.0–34.0)
MCHC: 33.7 g/dL (ref 30.0–36.0)
MCV: 87.7 fL (ref 78.0–100.0)
MPV: 10.4 fL (ref 8.6–12.4)
Monocytes Absolute: 0.5 10*3/uL (ref 0.1–1.0)
Monocytes Relative: 12 % (ref 3–12)
Neutro Abs: 2.2 10*3/uL (ref 1.7–7.7)
Neutrophils Relative %: 58 % (ref 43–77)
Platelets: 236 10*3/uL (ref 150–400)
RBC: 4.13 MIL/uL — ABNORMAL LOW (ref 4.22–5.81)
RDW: 13.4 % (ref 11.5–15.5)
WBC: 3.8 10*3/uL — ABNORMAL LOW (ref 4.0–10.5)

## 2015-01-05 LAB — COMPLETE METABOLIC PANEL WITH GFR
ALT: 16 U/L (ref 0–53)
AST: 19 U/L (ref 0–37)
Albumin: 4.8 g/dL (ref 3.5–5.2)
Alkaline Phosphatase: 52 U/L (ref 39–117)
BUN: 41 mg/dL — ABNORMAL HIGH (ref 6–23)
CO2: 29 mEq/L (ref 19–32)
Calcium: 9.9 mg/dL (ref 8.4–10.5)
Chloride: 100 mEq/L (ref 96–112)
Creat: 1.62 mg/dL — ABNORMAL HIGH (ref 0.50–1.35)
GFR, Est African American: 51 mL/min — ABNORMAL LOW
GFR, Est Non African American: 45 mL/min — ABNORMAL LOW
Glucose, Bld: 89 mg/dL (ref 70–99)
Potassium: 4 mEq/L (ref 3.5–5.3)
Sodium: 139 mEq/L (ref 135–145)
Total Bilirubin: 0.5 mg/dL (ref 0.2–1.2)
Total Protein: 7.9 g/dL (ref 6.0–8.3)

## 2015-01-05 LAB — LIPID PANEL
Cholesterol: 144 mg/dL (ref 0–200)
HDL: 59 mg/dL (ref >39)
LDL Cholesterol: 76 mg/dL (ref 0–99)
Total CHOL/HDL Ratio: 2.4 Ratio
Triglycerides: 46 mg/dL (ref <150)
VLDL: 9 mg/dL (ref 0–40)

## 2015-01-05 MED ORDER — PRAVASTATIN SODIUM 40 MG PO TABS: 40.0000 mg | ORAL_TABLET | Freq: Every morning | ORAL | Status: DC

## 2015-01-05 MED ORDER — HYDROCHLOROTHIAZIDE 25 MG PO TABS: 25.0000 mg | ORAL_TABLET | Freq: Every day | ORAL | Status: DC

## 2015-01-05 MED ORDER — VERAPAMIL HCL 180 MG (CO) PO TB24: 180.0000 mg | ORAL_TABLET | Freq: Every day | ORAL | Status: DC

## 2015-01-05 NOTE — Progress Notes (Signed)
Subjective:    Patient ID: Alan Shah, male    DOB: Oct 05, 1951, 64 y.o.   MRN: [id number]  HPI This 64 y.o. Male is here for CPE; he has well controlled HTN and lipid disorder. Pt is compliant w/ medications w/o adverse effects. He has no new complaints; Urology monitors prostate disease.  HCM: CRS- 2011 w/ 5-yr recall.           IMM- Current; declines Flu vaccine.           Vision- Every year; wears "readers".           Dental- Current.   Patient Active Problem List   Diagnosis Date Noted  . Stage T1c Prostate Cancer, Gleasons 3+4, PSA 8.2  s/p Prostate Seed Implant 09/08/13 10/21/2013  . Hypercholesteremia 01/10/2013  . Unspecified vitamin D deficiency 01/10/2013  . Hypertrophy of prostate without urinary obstruction and other lower urinary tract symptoms (LUTS) 01/10/2013  . HTN (hypertension) 06/01/2012  . Overweight (BMI 25.0-29.9) 06/01/2012    Prior to Admission medications   Medication Sig Start Date End Date Taking? Authorizing Provider  aspirin 81 MG tablet Take 81 mg by mouth daily.   Yes Historical Provider, MD  hydrochlorothiazide (HYDRODIURIL) 25 MG tablet Take 1 tablet (25 mg total) by mouth daily. 01/04/14  Yes Barton Fanny, MD  Multiple Vitamin (MULTIVITAMIN) tablet Take 1 tablet by mouth daily.   Yes Historical Provider, MD  pravastatin (PRAVACHOL) 40 MG tablet Take 1 tablet (40 mg total) by mouth every morning. 01/04/14  Yes Barton Fanny, MD  verapamil (COVERA HS) 180 MG (CO) 24 hr tablet Take 1 tablet (180 mg total) by mouth at bedtime. 01/04/14  Yes Barton Fanny, MD    Past Surgical History  Procedure Laterality Date  . Colonoscopy    . Prostate biopsy  02/24/2013  &   06-07-2013  . Orif displaced right two-part proximal humerous fx  03-05-2010  . Right shoulder surger for fx  2010  . Radioactive seed implant N/A 09/08/2013    Procedure: RADIOACTIVE SEED IMPLANT;  Surgeon: Claybon Jabs, MD;  Location: [facility];  Service:  Urology;  Laterality: N/A;  . Prostate surgery      History   Social History  . Marital Status: Single    Spouse Name: N/A    Number of Children: N/A  . Years of Education: N/A   Occupational History  . RETIRED    Social History Main Topics  . Smoking status: Never Smoker   . Smokeless tobacco: Never Used  . Alcohol Use: 0.6 oz/week    1 Not specified per week     Comment: RARE  . Drug Use: No  . Sexual Activity: Not on file   Other Topics Concern  . Not on file   Social History Narrative    Family History  Problem Relation Age of Onset  . Diabetes Mother   . Cancer Maternal Uncle     prostate     Review of Systems  Constitutional: Negative.   HENT: Negative.   Eyes: Negative.   Respiratory: Negative.   Cardiovascular: Negative.   Gastrointestinal: Negative.   Endocrine: Negative.   Genitourinary: Negative.   Musculoskeletal: Negative.   Skin: Negative.   Allergic/Immunologic: Negative.   Neurological: Negative.   Hematological: Negative.   Psychiatric/Behavioral: Negative.       Objective:   Physical Exam  Constitutional: He is oriented to person, place, and time. Vital signs are normal.  He appears well-developed and well-nourished. No distress.  Blood pressure 114/70, pulse 80, temperature 98.1 F (36.7 C), temperature source Oral, resp. rate 16, height 5\' 9"  (1.753 m), weight 190 lb 12.8 oz (86.546 kg), SpO2 98 %.   HENT:  Head: Normocephalic and atraumatic.  Right Ear: Hearing, tympanic membrane, external ear and ear canal normal.  Left Ear: Hearing, tympanic membrane, external ear and ear canal normal.  Nose: Nose normal. No nasal deformity or septal deviation.  Mouth/Throat: Uvula is midline, oropharynx is clear and moist and mucous membranes are normal. No oral lesions. Normal dentition.  Eyes: Lids are normal. Pupils are equal, round, and reactive to light. No scleral icterus.  Fundoscopic exam:      The right eye shows red reflex.        The left eye shows red reflex.  Arcus senilis present bilaterally. Fundoscopic exam difficult.  Neck: Trachea normal, full passive range of motion without pain and phonation normal. Neck supple. No JVD present. No spinous process tenderness and no muscular tenderness present. Carotid bruit is not present. Decreased range of motion present. No thyroid mass and no thyromegaly present.  Cardiovascular: Normal rate, regular rhythm, S1 normal, S2 normal, normal heart sounds, intact distal pulses and normal pulses.   No extrasystoles are present. PMI is not displaced.  Exam reveals no gallop and no friction rub.   No murmur heard. Pulmonary/Chest: Effort normal and breath sounds normal. No respiratory distress. He has no decreased breath sounds. He has no wheezes.  Abdominal: Soft. Normal appearance and bowel sounds are normal. He exhibits no distension, no abdominal bruit, no pulsatile midline mass and no mass. There is no hepatosplenomegaly. There is no tenderness. There is no guarding and no CVA tenderness.  Genitourinary:  Deferred.  Musculoskeletal:       Cervical back: He exhibits decreased range of motion. He exhibits no tenderness, no bony tenderness, no deformity, no pain and no spasm.       Thoracic back: Normal.       Lumbar back: Normal.  Remainder of exam unremarkable.  Lymphadenopathy:       Head (right side): No submental, no submandibular, no tonsillar, no preauricular, no posterior auricular and no occipital adenopathy present.       Head (left side): No submental, no submandibular, no tonsillar, no preauricular, no posterior auricular and no occipital adenopathy present.    He has no cervical adenopathy.    He has no axillary adenopathy.       Right: No inguinal and no supraclavicular adenopathy present.       Left: No inguinal and no supraclavicular adenopathy present.  Neurological: He is alert and oriented to person, place, and time. He has normal strength. He displays no atrophy.  No cranial nerve deficit or sensory deficit. He exhibits normal muscle tone. He displays a negative Romberg sign. Coordination and gait normal. He displays no Babinski's sign on the right side. He displays no Babinski's sign on the left side.  Reflex Scores:      Tricep reflexes are 1+ on the right side and 1+ on the left side.      Bicep reflexes are 2+ on the right side and 2+ on the left side.      Brachioradialis reflexes are 1+ on the right side and 1+ on the left side.      Patellar reflexes are 2+ on the right side and 2+ on the left side. Skin: Skin is warm, dry and intact.  No ecchymosis, no lesion and no rash noted. He is not diaphoretic. No cyanosis or erythema. Nails show no clubbing.  Psychiatric: He has a normal mood and affect. His speech is normal and behavior is normal. Judgment and thought content normal. Cognition and memory are normal.  Nursing note and vitals reviewed.      Assessment & Plan:  Encounter for routine history and physical exam for male - Continue current lifestyle practices. Plan: COMPLETE METABOLIC PANEL WITH GFR  Essential hypertension - Stable and controlled on current medications. Plan: hydrochlorothiazide (HYDRODIURIL) 25 MG tablet, CBC with Differential/Platelet, COMPLETE METABOLIC PANEL WITH GFR  Hypercholesteremia - No adverse effects w/ current statin; continue same. Plan: Lipid panel  Need for prophylactic vaccination against Streptococcus pneumoniae (pneumococcus) - Plan: Pneumococcal conjugate vaccine 13-valent IM   Meds ordered this encounter  Medications  . verapamil (COVERA HS) 180 MG (CO) 24 hr tablet    Sig: Take 1 tablet (180 mg total) by mouth at bedtime.    Dispense:  90 tablet    Refill:  3  . hydrochlorothiazide (HYDRODIURIL) 25 MG tablet    Sig: Take 1 tablet (25 mg total) by mouth daily.    Dispense:  90 tablet    Refill:  3  . pravastatin (PRAVACHOL) 40 MG tablet    Sig: Take 1 tablet (40 mg total) by mouth every morning.      Dispense:  90 tablet    Refill:  3

## 2015-01-05 NOTE — Patient Instructions (Addendum)
Keeping you healthy  Get these tests  Blood pressure- Have your blood pressure checked once a year by your healthcare provider.  Normal blood pressure is 120/80  Weight- Have your body mass index (BMI) calculated to screen for obesity.  BMI is a measure of body fat based on height and weight. You can also calculate your own BMI at ViewBanking.si.  Cholesterol- Have your cholesterol checked every year.  Diabetes- Have your blood sugar checked regularly if you have high blood pressure, high cholesterol, have a family history of diabetes or if you are overweight.  Screening for Colon Cancer- Colonoscopy starting at age 26.  Screening may begin sooner depending on your family history and other health conditions. Follow up colonoscopy as directed by your Gastroenterologist.  Screening for Prostate Cancer- Both blood work (PSA) and a rectal exam help screen for Prostate Cancer.  Screening begins at age 59 with African-American men and at age 33 with Caucasian men.  Screening may begin sooner depending on your family history.  Take these medicines  Aspirin- One aspirin daily can help prevent Heart disease and Stroke.  Flu shot- Every fall.  Tetanus- Every 10 years.  Zostavax- Once after the age of 53 to prevent Shingles.  Pneumonia shot- Once after the age of 73; if you are younger than 16, ask your healthcare provider if you need a Pneumonia shot.  Take these steps  Don't smoke- If you do smoke, talk to your doctor about quitting.  For tips on how to quit, go to www.smokefree.gov or call 1-800-QUIT-NOW.  Be physically active- Exercise 5 days a week for at least 30 minutes.  If you are not already physically active start slow and gradually work up to 30 minutes of moderate physical activity.  Examples of moderate activity include walking briskly, mowing the yard, dancing, swimming, bicycling, etc.  Eat a healthy diet- Eat a variety of healthy food such as fruits, vegetables, low  fat milk, low fat cheese, yogurt, lean meant, poultry, fish, beans, tofu, etc. For more information go to www.thenutritionsource.org  Drink alcohol in moderation- Limit alcohol intake to less than two drinks a day. Never drink and drive.  Dentist- Brush and floss twice daily; visit your dentist twice a year.  Depression- Your emotional health is as important as your physical health. If you're feeling down, or losing interest in things you would normally enjoy please talk to your healthcare provider.  Eye exam- Visit your eye doctor every year.  Safe sex- If you may be exposed to a sexually transmitted infection, use a condom.  Seat belts- Seat belts can save your life; always wear one.  Smoke/Carbon Monoxide detectors- These detectors need to be installed on the appropriate level of your home.  Replace batteries at least once a year.  Skin cancer- When out in the sun, cover up and use sunscreen 15 SPF or higher.  Violence- If anyone is threatening you, please tell your healthcare provider.  Living Will/ Health care power of attorney- Speak with your healthcare provider and family.   Today, you received [id number]; return to the clinic in July of this year or you can wait until you turn 65 to get Pneumovax (Pnuemonia vaccine 23-valent).  You are doing very well with your medications; keep an check on your blood pressure. You can return here in ~ 6 months for BP check and possible labs; otherwise, 1 year follow-up is sufficient.

## 2016-01-08 ENCOUNTER — Encounter: Payer: Self-pay | Admitting: Family Medicine

## 2016-01-08 ENCOUNTER — Ambulatory Visit (INDEPENDENT_AMBULATORY_CARE_PROVIDER_SITE_OTHER): Payer: BLUE CROSS/BLUE SHIELD | Admitting: Family Medicine

## 2016-01-08 VITALS — BP 120/70 | HR 83 | Temp 98.0°F | Resp 16 | Ht 68.5 in | Wt 195.0 lb

## 2016-01-08 DIAGNOSIS — Z6829 Body mass index (BMI) 29.0-29.9, adult: Secondary | ICD-10-CM

## 2016-01-08 DIAGNOSIS — I10 Essential (primary) hypertension: Secondary | ICD-10-CM | POA: Diagnosis not present

## 2016-01-08 DIAGNOSIS — Z23 Encounter for immunization: Secondary | ICD-10-CM | POA: Diagnosis not present

## 2016-01-08 DIAGNOSIS — D649 Anemia, unspecified: Secondary | ICD-10-CM | POA: Diagnosis not present

## 2016-01-08 DIAGNOSIS — Z0000 Encounter for general adult medical examination without abnormal findings: Secondary | ICD-10-CM

## 2016-01-08 DIAGNOSIS — E7800 Pure hypercholesterolemia, unspecified: Secondary | ICD-10-CM | POA: Diagnosis not present

## 2016-01-08 LAB — LIPID PANEL
Cholesterol: 186 mg/dL (ref 125–200)
HDL: 78 mg/dL (ref >=40)
LDL Cholesterol: 96 mg/dL (ref <130)
Total CHOL/HDL Ratio: 2.4 Ratio (ref <=5.0)
Triglycerides: 58 mg/dL (ref <150)
VLDL: 12 mg/dL (ref <30)

## 2016-01-08 LAB — COMPLETE METABOLIC PANEL WITH GFR
ALT: 16 U/L (ref 9–46)
AST: 22 U/L (ref 10–35)
Albumin: 4.7 g/dL (ref 3.6–5.1)
Alkaline Phosphatase: 49 U/L (ref 40–115)
BUN: 32 mg/dL — ABNORMAL HIGH (ref 7–25)
CO2: 25 mmol/L (ref 20–31)
Calcium: 10 mg/dL (ref 8.6–10.3)
Chloride: 103 mmol/L (ref 98–110)
Creat: 1.9 mg/dL — ABNORMAL HIGH (ref 0.70–1.25)
GFR, Est African American: 42 mL/min — ABNORMAL LOW (ref >=60)
GFR, Est Non African American: 36 mL/min — ABNORMAL LOW (ref >=60)
Glucose, Bld: 80 mg/dL (ref 65–99)
Potassium: 3.8 mmol/L (ref 3.5–5.3)
Sodium: 141 mmol/L (ref 135–146)
Total Bilirubin: 0.6 mg/dL (ref 0.2–1.2)
Total Protein: 7.7 g/dL (ref 6.1–8.1)

## 2016-01-08 LAB — CBC
HCT: 34.9 % — ABNORMAL LOW (ref 39.0–52.0)
Hemoglobin: 11.7 g/dL — ABNORMAL LOW (ref 13.0–17.0)
MCH: 29.9 pg (ref 26.0–34.0)
MCHC: 33.5 g/dL (ref 30.0–36.0)
MCV: 89.3 fL (ref 78.0–100.0)
MPV: 10.2 fL (ref 8.6–12.4)
Platelets: 304 10*3/uL (ref 150–400)
RBC: 3.91 MIL/uL — ABNORMAL LOW (ref 4.22–5.81)
RDW: 13.5 % (ref 11.5–15.5)
WBC: 3.9 10*3/uL — ABNORMAL LOW (ref 4.0–10.5)

## 2016-01-08 MED ORDER — HYDROCHLOROTHIAZIDE 25 MG PO TABS
25.0000 mg | ORAL_TABLET | Freq: Every day | ORAL | Status: DC
Start: 2016-01-08 — End: 2016-10-08

## 2016-01-08 MED ORDER — VERAPAMIL HCL ER 180 MG PO CP24: 180.0000 mg | ORAL_CAPSULE | Freq: Every day | ORAL | Status: DC

## 2016-01-08 MED ORDER — PRAVASTATIN SODIUM 40 MG PO TABS: 40.0000 mg | ORAL_TABLET | Freq: Every morning | ORAL | Status: DC

## 2016-01-08 NOTE — Patient Instructions (Signed)
Please work to limit juice and sweet tea from your diet Add stretching and core strengthening exercises to your routine  Please make an appointment with your eye doctor for this month     Why follow it? Research shows. . Those who follow the Mediterranean diet have a reduced risk of heart disease  . The diet is associated with a reduced incidence of Parkinson's and Alzheimer's diseases . People following the diet may have longer life expectancies and lower rates of chronic diseases  . The Dietary Guidelines for Americans recommends the Mediterranean diet as an eating plan to promote health and prevent disease  What Is the Mediterranean Diet?  . Healthy eating plan based on typical foods and recipes of Mediterranean-style cooking . The diet is primarily a plant based diet; these foods should make up a majority of meals   Starches - Plant based foods should make up a majority of meals - They are an important sources of vitamins, minerals, energy, antioxidants, and fiber - Choose whole grains, foods high in fiber and minimally processed items  - Typical grain sources include wheat, oats, barley, corn, brown rice, bulgar, farro, millet, polenta, couscous  - Various types of beans include chickpeas, lentils, fava beans, black beans, white beans   Fruits  Veggies - Large quantities of antioxidant rich fruits & veggies; 6 or more servings  - Vegetables can be eaten raw or lightly drizzled with oil and cooked  - Vegetables common to the traditional Mediterranean Diet include: artichokes, arugula, beets, broccoli, brussel sprouts, cabbage, carrots, celery, collard greens, cucumbers, eggplant, kale, leeks, lemons, lettuce, mushrooms, okra, onions, peas, peppers, potatoes, pumpkin, radishes, rutabaga, shallots, spinach, sweet potatoes, turnips, zucchini - Fruits common to the Mediterranean Diet include: apples, apricots, avocados, cherries, clementines, dates, figs, grapefruits, grapes, melons,  nectarines, oranges, peaches, pears, pomegranates, strawberries, tangerines  Fats - Replace butter and margarine with healthy oils, such as olive oil, canola oil, and tahini  - Limit nuts to no more than a handful a day  - Nuts include walnuts, almonds, pecans, pistachios, pine nuts  - Limit or avoid candied, honey roasted or heavily salted nuts - Olives are central to the [employer] - can be eaten whole or used in a variety of dishes   Meats Protein - Limiting red meat: no more than a few times a month - When eating red meat: choose lean cuts and keep the portion to the size of deck of cards - Eggs: approx. 0 to 4 times a week  - Fish and lean poultry: at least 2 a week  - Healthy protein sources include, chicken, [place], lean beef, lamb - Increase intake of seafood such as tuna, salmon, trout, mackerel, shrimp, scallops - Avoid or limit high fat processed meats such as sausage and bacon  Dairy - Include moderate amounts of low fat dairy products  - Focus on healthy dairy such as fat free yogurt, skim milk, low or reduced fat cheese - Limit dairy products higher in fat such as whole or 2% milk, cheese, ice cream  Alcohol - Moderate amounts of red wine is ok  - No more than 5 oz daily for women (all ages) and men older than age 4  - No more than 10 oz of wine daily for men younger than 31  Other - Limit sweets and other desserts  - Use herbs and spices instead of salt to flavor foods  - Herbs and spices common to the traditional Mediterranean Diet include:  basil, bay leaves, chives, cloves, cumin, fennel, garlic, lavender, marjoram, mint, oregano, parsley, pepper, rosemary, sage, savory, sumac, tarragon, thyme   It's not just a diet, it's a lifestyle:  . The Mediterranean diet includes lifestyle factors typical of those in the region  . Foods, drinks and meals are best eaten with others and savored . Daily physical activity is important for overall good health . This could be  strenuous exercise like running and aerobics . This could also be more leisurely activities such as walking, housework, yard-work, or taking the stairs . Moderation is the key; a balanced and healthy diet accommodates most foods and drinks . Consider portion sizes and frequency of consumption of certain foods   Meal Ideas & Options:  . Breakfast:  o Whole wheat toast or whole wheat English muffins with peanut butter & hard boiled egg o Steel cut oats topped with apples & cinnamon and skim milk  o Fresh fruit: banana, strawberries, melon, berries, peaches  o Smoothies: strawberries, bananas, greek yogurt, peanut butter o Low fat greek yogurt with blueberries and granola  o Egg white omelet with spinach and mushrooms o Breakfast couscous: whole wheat couscous, apricots, skim milk, cranberries  . Sandwiches:  o Hummus and grilled vegetables (peppers, zucchini, squash) on whole wheat bread   o Grilled chicken on whole wheat pita with lettuce, tomatoes, cucumbers or tzatziki  o Tuna salad on whole wheat bread: tuna salad made with greek yogurt, olives, red peppers, capers, green onions o Garlic rosemary lamb pita: lamb sauted with garlic, rosemary, salt & pepper; add lettuce, cucumber, greek yogurt to pita - flavor with lemon juice and black pepper  . Seafood:  o Mediterranean grilled salmon, seasoned with garlic, basil, parsley, lemon juice and black pepper o Shrimp, lemon, and spinach whole-grain pasta salad made with low fat greek yogurt  o Seared scallops with lemon orzo  o Seared tuna steaks seasoned salt, pepper, coriander topped with tomato mixture of olives, tomatoes, olive oil, minced garlic, parsley, green onions and cappers  . Meats:  o Herbed greek chicken salad with kalamata olives, cucumber, feta  o Red bell peppers stuffed with spinach, bulgur, lean ground beef (or lentils) & topped with feta   o Kebabs: skewers of chicken, tomatoes, onions, zucchini, squash  o [place] burgers:  made with red onions, mint, dill, lemon juice, feta cheese topped with roasted red peppers . Vegetarian o Cucumber salad: cucumbers, artichoke hearts, celery, red onion, feta cheese, tossed in olive oil & lemon juice  o Hummus and whole grain pita points with a greek salad (lettuce, tomato, feta, olives, cucumbers, red onion) o Lentil soup with celery, carrots made with vegetable broth, garlic, salt and pepper  o Tabouli salad: parsley, bulgur, mint, scallions, cucumbers, tomato, radishes, lemon juice, olive oil, salt and pepper.

## 2016-01-08 NOTE — Progress Notes (Signed)
Subjective:    Patient ID: Alan Shah, male    DOB: 1951-10-01, 65 y.o.   MRN: [id number]  HPI This is a pleasant 65 yo male who presents today for CPE. He is retired. He is married. He enjoys working in his yard and exercising.  Last CPE- 2/16 PSA- last month, had annual urology appointment Colonoscopy- 7/16 Tdap- 2011 Zoster- 10/12 Prevnar- 01/05/15 Flu- annual Dental- regular Eye- annually Exercise- treadmill, stationary bike  Past Medical History  Diagnosis Date  . Hypertension   . Prostate cancer ([place])     adenocarcinoma gleason 7  . Hyperlipidemia    Past Surgical History  Procedure Laterality Date  . Colonoscopy    . Prostate biopsy  02/24/2013  &   06-07-2013  . Orif displaced right two-part proximal humerous fx  03-05-2010  . Right shoulder surger for fx  2010  . Radioactive seed implant N/A 09/08/2013    Procedure: RADIOACTIVE SEED IMPLANT;  Surgeon: Claybon Jabs, MD;  Location: [facility];  Service: Urology;  Laterality: N/A;  . Prostate surgery     Family History  Problem Relation Age of Onset  . Diabetes Mother   . Cancer Maternal Uncle     prostate   Social History  Substance Use Topics  . Smoking status: Never Smoker   . Smokeless tobacco: Never Used  . Alcohol Use: 0.6 oz/week    1 Standard drinks or equivalent per week     Comment: RARE     Review of Systems  Constitutional: Negative.   HENT: Negative.   Eyes:       Has noticed some difficulty with vision in left eye. Seems less focused. No pain, no drainage, no floaters, not worse with nighttime driving.   Respiratory: Negative.   Cardiovascular: Negative.   Gastrointestinal: Negative.   Endocrine: Negative.   Genitourinary: Negative.   Musculoskeletal: Negative.   Skin: Negative.   Allergic/Immunologic: Negative.   Neurological: Negative.   Hematological: Negative.   Psychiatric/Behavioral: Negative.        Objective:   Physical Exam Physical Exam    Constitutional: He is oriented to person, place, and time. He appears well-developed and well-nourished.  HENT:  Head: Normocephalic and atraumatic.  Right Ear: External ear normal.  Left Ear: External ear normal.  Nose: Nose normal.  Mouth/Throat: Oropharynx is clear and moist.  Eyes: Conjunctivae are normal. Pupils are equal, round, and reactive to light.  Neck: Normal range of motion. Neck supple.  Cardiovascular: Normal rate, regular rhythm, normal heart sounds and intact distal pulses.   Pulmonary/Chest: Effort normal and breath sounds normal.  Abdominal: Soft. Bowel sounds are normal. Hernia confirmed negative in the right inguinal area and confirmed negative in the left inguinal area.  Musculoskeletal: Normal range of motion. He exhibits no edema or tenderness.       Cervical back: Normal.       Thoracic back: Normal.       Lumbar back: Normal.  Lymphadenopathy:    He has no cervical adenopathy.       Right: No inguinal adenopathy present.       Left: No inguinal adenopathy present.  Neurological: He is alert and oriented to person, place, and time. He has normal reflexes.  Skin: Skin is warm and dry.  Psychiatric: He has a normal mood and affect. His behavior is normal. Judgment normal.  Vitals reviewed.  BP 120/70 mmHg  Pulse 83  Temp(Src) 98 F (36.7 C)  Resp 16  Ht 5' 8.5" (1.74 m)  Wt 195 lb (88.451 kg)  BMI 29.21 kg/m2 Wt Readings from Last 3 Encounters:  01/08/16 195 lb (88.451 kg)  01/05/15 190 lb 12.8 oz (86.546 kg)  07/05/14 185 lb 6.4 oz (84.097 kg)       Assessment & Plan:  1. Annual physical exam -- Discussed and encouraged healthy lifestyle choices- adequate sleep, regular exercise, stress management and healthy food choices.    2. BMI 29.0-29.9,adult - provided information regarding Med Diet and encouraged him to avoid soda, sweet tea and juice - Hemoglobin A1c  3. Anemia, unspecified anemia type - CBC  4. Essential hypertension - well  controlled on current medications - COMPLETE METABOLIC PANEL WITH GFR - hydrochlorothiazide (HYDRODIURIL) 25 MG tablet; Take 1 tablet (25 mg total) by mouth daily.  Dispense: 90 tablet; Refill: 3 - verapamil (VERELAN PM) 180 MG 24 hr capsule; Take 1 capsule (180 mg total) by mouth at bedtime.  Dispense: 90 capsule; Refill: 3  5. Hypercholesteremia - Lipid panel - COMPLETE METABOLIC PANEL WITH GFR - pravastatin (PRAVACHOL) 40 MG tablet; Take 1 tablet (40 mg total) by mouth every morning.  Dispense: 90 tablet; Refill: 3  6. Need for prophylactic vaccination against Streptococcus pneumoniae (pneumococcus) - patient left prior to receiving vaccine- will give at follow up visit - Pneumococcal polysaccharide vaccine 23-valent greater than or equal to 2yo subcutaneous/IM  7. Decreased visual acuity, left - suspect cataracts, instructed patient to schedule follow up with opthalmologist with in the next two weeks.  Clarene Reamer, FNP-BC  -follow in 6 months  [facility]  01/13/2016 9:14 AM

## 2016-01-09 LAB — HEMOGLOBIN A1C
Hgb A1c MFr Bld: 6.1 % — ABNORMAL HIGH (ref <5.7)
Mean Plasma Glucose: 128 mg/dL — ABNORMAL HIGH (ref <117)

## 2016-01-15 ENCOUNTER — Other Ambulatory Visit: Payer: Self-pay | Admitting: Family Medicine

## 2016-01-15 DIAGNOSIS — R7989 Other specified abnormal findings of blood chemistry: Secondary | ICD-10-CM

## 2016-01-15 DIAGNOSIS — D649 Anemia, unspecified: Secondary | ICD-10-CM

## 2016-01-15 DIAGNOSIS — R944 Abnormal results of kidney function studies: Secondary | ICD-10-CM

## 2016-01-31 ENCOUNTER — Encounter: Payer: Self-pay | Admitting: Hematology

## 2016-01-31 ENCOUNTER — Telehealth: Payer: Self-pay | Admitting: Hematology

## 2016-01-31 NOTE — Telephone Encounter (Signed)
New pt ref mailed and sent off

## 2016-01-31 NOTE — Telephone Encounter (Signed)
Spoke to pt in reference to ref from [place]. Scheduled pt for 3/15 at 11am w/ Dr. Irene Limbo. Pt confirmed time and date.

## 2016-02-01 ENCOUNTER — Encounter: Payer: Self-pay | Admitting: Hematology

## 2016-02-01 ENCOUNTER — Encounter: Payer: Self-pay | Admitting: Family Medicine

## 2016-02-06 ENCOUNTER — Telehealth: Payer: Self-pay | Admitting: Hematology

## 2016-02-06 NOTE — Telephone Encounter (Signed)
Pt called to confirm appt time and date. Pt also is aware of location. States he will be here

## 2016-02-13 ENCOUNTER — Ambulatory Visit (HOSPITAL_BASED_OUTPATIENT_CLINIC_OR_DEPARTMENT_OTHER): Payer: BLUE CROSS/BLUE SHIELD | Admitting: Hematology

## 2016-02-13 ENCOUNTER — Ambulatory Visit (HOSPITAL_BASED_OUTPATIENT_CLINIC_OR_DEPARTMENT_OTHER): Payer: BLUE CROSS/BLUE SHIELD

## 2016-02-13 ENCOUNTER — Telehealth: Payer: Self-pay | Admitting: Hematology

## 2016-02-13 ENCOUNTER — Encounter: Payer: Self-pay | Admitting: Hematology

## 2016-02-13 VITALS — BP 147/82 | HR 82 | Temp 98.4°F | Resp 18 | Ht 68.5 in | Wt 200.2 lb

## 2016-02-13 DIAGNOSIS — I10 Essential (primary) hypertension: Secondary | ICD-10-CM

## 2016-02-13 DIAGNOSIS — N189 Chronic kidney disease, unspecified: Secondary | ICD-10-CM

## 2016-02-13 DIAGNOSIS — D649 Anemia, unspecified: Secondary | ICD-10-CM | POA: Diagnosis not present

## 2016-02-13 DIAGNOSIS — D631 Anemia in chronic kidney disease: Secondary | ICD-10-CM

## 2016-02-13 DIAGNOSIS — C61 Malignant neoplasm of prostate: Secondary | ICD-10-CM | POA: Diagnosis not present

## 2016-02-13 LAB — COMPREHENSIVE METABOLIC PANEL
ALT: 18 U/L (ref 0–55)
AST: 22 U/L (ref 5–34)
Albumin: 4.4 g/dL (ref 3.5–5.0)
Alkaline Phosphatase: 50 U/L (ref 40–150)
Anion Gap: 7 mEq/L (ref 3–11)
BUN: 30.6 mg/dL — ABNORMAL HIGH (ref 7.0–26.0)
CO2: 26 mEq/L (ref 22–29)
Calcium: 9.8 mg/dL (ref 8.4–10.4)
Chloride: 108 mEq/L (ref 98–109)
Creatinine: 1.9 mg/dL — ABNORMAL HIGH (ref 0.7–1.3)
EGFR: 43 mL/min/{1.73_m2} — ABNORMAL LOW (ref >90)
Glucose: 91 mg/dl (ref 70–140)
Potassium: 4 mEq/L (ref 3.5–5.1)
Sodium: 142 mEq/L (ref 136–145)
Total Bilirubin: 0.49 mg/dL (ref 0.20–1.20)
Total Protein: 7.9 g/dL (ref 6.4–8.3)

## 2016-02-13 LAB — CBC & DIFF AND RETIC
BASO%: 0.5 % (ref 0.0–2.0)
Basophils Absolute: 0 10*3/uL (ref 0.0–0.1)
EOS%: 2.2 % (ref 0.0–7.0)
Eosinophils Absolute: 0.1 10*3/uL (ref 0.0–0.5)
HCT: 34.3 % — ABNORMAL LOW (ref 38.4–49.9)
HGB: 11.5 g/dL — ABNORMAL LOW (ref 13.0–17.1)
Immature Retic Fract: 2.8 % — ABNORMAL LOW (ref 3.00–10.60)
LYMPH%: 30.1 % (ref 14.0–49.0)
MCH: 29.4 pg (ref 27.2–33.4)
MCHC: 33.5 g/dL (ref 32.0–36.0)
MCV: 87.7 fL (ref 79.3–98.0)
MONO#: 0.3 10*3/uL (ref 0.1–0.9)
MONO%: 8 % (ref 0.0–14.0)
NEUT#: 2.5 10*3/uL (ref 1.5–6.5)
NEUT%: 59.2 % (ref 39.0–75.0)
Platelets: 204 10*3/uL (ref 140–400)
RBC: 3.91 10*6/uL — ABNORMAL LOW (ref 4.20–5.82)
RDW: 13.6 % (ref 11.0–14.6)
Retic %: 0.74 % — ABNORMAL LOW (ref 0.80–1.80)
Retic Ct Abs: 28.93 10*3/uL — ABNORMAL LOW (ref 34.80–93.90)
WBC: 4.2 10*3/uL (ref 4.0–10.3)
lymph#: 1.3 10*3/uL (ref 0.9–3.3)

## 2016-02-13 LAB — FERRITIN: Ferritin: 258 ng/ml (ref 22–316)

## 2016-02-13 NOTE — Progress Notes (Deleted)
[name]    HEMATOLOGY/ONCOLOGY CONSULTATION NOTE  Date of Service: 02/13/2016  Patient Care Team: Kathie Rhodes, MD as PCP - General (Urology) Barton Fanny, MD (Family Medicine)  CHIEF COMPLAINTS/PURPOSE OF CONSULTATION:  ***  HISTORY OF PRESENTING ILLNESS:  Alan Shah is a wonderful 65 y.o. male who has been referred to [place] by Dr Merryl Hacker for evaluation and management of ***  MEDICAL HISTORY:  Past Medical History  Diagnosis Date  . Hypertension   . Prostate cancer ([place])     adenocarcinoma gleason 7  . Hyperlipidemia     SURGICAL HISTORY: Past Surgical History  Procedure Laterality Date  . Colonoscopy    . Prostate biopsy  02/24/2013  &   06-07-2013  . Orif displaced right two-part proximal humerous fx  03-05-2010  . Right shoulder surger for fx  2010  . Radioactive seed implant N/A 09/08/2013    Procedure: RADIOACTIVE SEED IMPLANT;  Surgeon: Claybon Jabs, MD;  Location: [facility];  Service: Urology;  Laterality: N/A;  . Prostate surgery      SOCIAL HISTORY: Social History   Social History  . Marital Status: Single    Spouse Name: N/A  . Number of Children: N/A  . Years of Education: N/A   Occupational History  . RETIRED    Social History Main Topics  . Smoking status: Never Smoker   . Smokeless tobacco: Never Used  . Alcohol Use: 0.6 oz/week    1 Standard drinks or equivalent per week     Comment: RARE  . Drug Use: No  . Sexual Activity: Yes   Other Topics Concern  . Not on file   Social History Narrative    FAMILY HISTORY: Family History  Problem Relation Age of Onset  . Diabetes Mother   . Cancer Maternal Uncle     prostate    ALLERGIES:  has No Known Allergies.  MEDICATIONS:  Current Outpatient Prescriptions  Medication Sig Dispense Refill  . aspirin 81 MG tablet Take 81 mg by mouth daily.    [name] CIALIS 20 MG tablet   1  . hydrochlorothiazide (HYDRODIURIL) 25 MG tablet Take 1 tablet (25 mg total) by mouth daily. 90  tablet 3  . Multiple Vitamin (MULTIVITAMIN) tablet Take 1 tablet by mouth daily.    . pravastatin (PRAVACHOL) 40 MG tablet Take 1 tablet (40 mg total) by mouth every morning. 90 tablet 3  . verapamil (VERELAN PM) 180 MG 24 hr capsule Take 1 capsule (180 mg total) by mouth at bedtime. 90 capsule 3   No current facility-administered medications for this visit.    REVIEW OF SYSTEMS:    10 Point review of Systems was done is negative except as noted above.  PHYSICAL EXAMINATION: ECOG PERFORMANCE STATUS: {CHL ONC ECOG [id number]  . Filed Vitals:   02/13/16 1041  BP: 147/82  Pulse: 82  Temp: 98.4 F (36.9 C)  Resp: 18   Filed Weights   02/13/16 1041  Weight: 200 lb 3.2 oz (90.81 kg)   .Body mass index is 29.99 kg/(m^2).  GENERAL:alert, in no acute distress and comfortable SKIN: skin color, texture, turgor are normal, no rashes or significant lesions EYES: normal, conjunctiva are pink and non-injected, sclera clear OROPHARYNX:no exudate, no erythema and lips, buccal mucosa, and tongue normal  NECK: supple, no JVD, thyroid normal size, non-tender, without nodularity LYMPH:  no palpable lymphadenopathy in the cervical, axillary or inguinal LUNGS: clear to auscultation with normal respiratory effort HEART: regular rate &  rhythm,  no murmurs and no lower extremity edema ABDOMEN: abdomen soft, non-tender, normoactive bowel sounds  Musculoskeletal: no cyanosis of digits and no clubbing  PSYCH: alert & oriented x 3 with fluent speech NEURO: no focal motor/sensory deficits  LABORATORY DATA:  I have reviewed the data as listed  . CBC Latest Ref Rng 01/08/2016 01/05/2015 01/04/2014  WBC 4.0 - 10.5 K/uL 3.9(L) 3.8(L) 3.5(L)  Hemoglobin 13.0 - 17.0 g/dL 11.7(L) 12.2(L) 13.4  Hematocrit 39.0 - 52.0 % 34.9(L) 36.2(L) 39.0  Platelets 150 - 400 K/uL 304 236 207   . CBC    Component Value Date/Time   WBC 3.9* 01/08/2016 1010   RBC 3.91* 01/08/2016 1010   HGB 11.7* 01/08/2016 1010    HCT 34.9* 01/08/2016 1010   PLT 304 01/08/2016 1010   MCV 89.3 01/08/2016 1010   MCH 29.9 01/08/2016 1010   MCHC 33.5 01/08/2016 1010   RDW 13.5 01/08/2016 1010   LYMPHSABS 1.0 01/05/2015 0938   MONOABS 0.5 01/05/2015 0938   EOSABS 0.1 01/05/2015 0938   BASOSABS 0.0 01/05/2015 0938     . CMP Latest Ref Rng 01/08/2016 01/05/2015 01/04/2014  Glucose 65 - 99 mg/dL 80 89 99  BUN 7 - 25 mg/dL 32(H) 41(H) 21  Creatinine 0.70 - 1.25 mg/dL 1.90(H) 1.62(H) 1.36(H)  Sodium 135 - 146 mmol/L 141 139 139  Potassium 3.5 - 5.3 mmol/L 3.8 4.0 3.7  Chloride 98 - 110 mmol/L 103 100 101  CO2 20 - 31 mmol/L 25 29 27   Calcium 8.6 - 10.3 mg/dL 10.0 9.9 10.2  Total Protein 6.1 - 8.1 g/dL 7.7 7.9 -  Total Bilirubin 0.2 - 1.2 mg/dL 0.6 0.5 -  Alkaline Phos 40 - 115 U/L 49 52 -  AST 10 - 35 U/L 22 19 -  ALT 9 - 46 U/L 16 16 18      RADIOGRAPHIC STUDIES: I have personally reviewed the radiological images as listed and agreed with the findings in the report. No results found.  ASSESSMENT & PLAN:  ***  All of the patients questions were answered with apparent satisfaction. The patient knows to call the clinic with any problems, questions or concerns.  I spent {CHL ONC TIME VISIT - [id number] counseling the patient face to face. The total time spent in the appointment was {CHL ONC TIME VISIT - [id number] and more than 50% was on counseling and direct patient cares.    Sullivan Lone MD [place] AAHIVMS [facility] Hematology/Oncology Physician [facility]  (Office):       [phone] (Work cell):  [phone] (Fax):           [phone]  02/13/2016 11:06 AM

## 2016-02-13 NOTE — Telephone Encounter (Signed)
Gave and pritned avs

## 2016-02-13 NOTE — Progress Notes (Signed)
[name]    HEMATOLOGY/ONCOLOGY CONSULTATION NOTE  Date of Service: 02/13/2016  Patient Care Team: Kathie Rhodes, MD as PCP - General (Urology) Barton Fanny, MD (Family Medicine)  CHIEF COMPLAINTS/PURPOSE OF CONSULTATION:  Anemia  HISTORY OF PRESENTING ILLNESS:  Alan Shah is a wonderful 65 y.o. male who has been referred to [place] by Dr .Claybon Jabs, MD for evaluation and management of anemia.  Patient has a history of hypertension, dyslipidemia, prostate cancer [T1C with Gleason score 3+4 and PSA of 8.2 pretreatment, treated with brachy radiation therapy], chronic kidney disease with a creatinine of 1.9 about one month ago.  Patient was noted to have mild normocytic normochromic anemia with a hemoglobin of 11.7, hematocrit of 34.9, WBC count of 3.9k with normal platelets of 304k. These labs were done for his annual physical examination. Today's creatinine went up from 1.6 to 1.9 over the last year and he has been referred to a nephrologist for management of chronic kidney disease.  He notes no chest pain no shortness of breath no dyspnea on exertion no new fatigue. No dysuria or hematuria normal or GI bleeding.  He had a colonoscopy in July 2016 that showed 3, 3-4 mm polyps in the transverse colon and in the rectum which were resected and retrieved.  One 5 mm polyp in the rectum which was resected.  Diverticulosis in the sigmoid colon.  He notes some erectile dysfunction for which he uses Cialis. Has not been on antiandrogen therapy for his prostate cancer. He notes no other concerns at this time.    MEDICAL HISTORY:  Past Medical History  Diagnosis Date  . Hypertension   . Prostate cancer ([place])     adenocarcinoma gleason 7  . Hyperlipidemia   CKD  SURGICAL HISTORY: Past Surgical History  Procedure Laterality Date  . Colonoscopy    . Prostate biopsy  02/24/2013  &   06-07-2013  . Orif displaced right two-part proximal humerous fx  03-05-2010  . Right shoulder surger  for fx  2010  . Radioactive seed implant N/A 09/08/2013    Procedure: RADIOACTIVE SEED IMPLANT;  Surgeon: Claybon Jabs, MD;  Location: [facility];  Service: Urology;  Laterality: N/A;  . Prostate surgery      SOCIAL HISTORY: Social History   Social History  . Marital Status: Single    Spouse Name: N/A  . Number of Children: N/A  . Years of Education: N/A   Occupational History  . RETIRED    Social History Main Topics  . Smoking status: Never Smoker   . Smokeless tobacco: Never Used  . Alcohol Use: 0.6 oz/week    1 Standard drinks or equivalent per week     Comment: RARE  . Drug Use: No  . Sexual Activity: Yes   Other Topics Concern  . Not on file   Social History Narrative    FAMILY HISTORY: Family History  Problem Relation Age of Onset  . Diabetes Mother   . Cancer Maternal Uncle     prostate    ALLERGIES:  has No Known Allergies.  MEDICATIONS:  Current Outpatient Prescriptions  Medication Sig Dispense Refill  . aspirin 81 MG tablet Take 81 mg by mouth daily.    [name] CIALIS 20 MG tablet   1  . hydrochlorothiazide (HYDRODIURIL) 25 MG tablet Take 1 tablet (25 mg total) by mouth daily. 90 tablet 3  . Multiple Vitamin (MULTIVITAMIN) tablet Take 1 tablet by mouth daily.    . pravastatin (  PRAVACHOL) 40 MG tablet Take 1 tablet (40 mg total) by mouth every morning. 90 tablet 3  . verapamil (VERELAN PM) 180 MG 24 hr capsule Take 1 capsule (180 mg total) by mouth at bedtime. 90 capsule 3   No current facility-administered medications for this visit.    REVIEW OF SYSTEMS:    10 Point review of Systems was done is negative except as noted above.  PHYSICAL EXAMINATION: ECOG PERFORMANCE STATUS: 1 - Symptomatic but completely ambulatory  . Filed Vitals:   02/13/16 1041  BP: 147/82  Pulse: 82  Temp: 98.4 F (36.9 C)  Resp: 18   Filed Weights   02/13/16 1041  Weight: 200 lb 3.2 oz (90.81 kg)   .Body mass index is 29.99  kg/(m^2).  GENERAL:alert, in no acute distress and comfortable SKIN: skin color, texture, turgor are normal, no rashes or significant lesions EYES: normal, conjunctiva are pink and non-injected, sclera clear OROPHARYNX:no exudate, no erythema and lips, buccal mucosa, and tongue normal  NECK: supple, no JVD, thyroid normal size, non-tender, without nodularity LYMPH:  no palpable lymphadenopathy in the cervical, axillary or inguinal LUNGS: clear to auscultation with normal respiratory effort HEART: regular rate & rhythm,  no murmurs and no lower extremity edema ABDOMEN: abdomen soft, non-tender, normoactive bowel sounds  Musculoskeletal: no cyanosis of digits and no clubbing  PSYCH: alert & oriented x 3 with fluent speech NEURO: no focal motor/sensory deficits  LABORATORY DATA:  I have reviewed the data as listed  . CBC Latest Ref Rng 01/08/2016 01/05/2015 01/04/2014  WBC 4.0 - 10.5 K/uL 3.9(L) 3.8(L) 3.5(L)  Hemoglobin 13.0 - 17.0 g/dL 11.7(L) 12.2(L) 13.4  Hematocrit 39.0 - 52.0 % 34.9(L) 36.2(L) 39.0  Platelets 150 - 400 K/uL 304 236 207   . CBC    Component Value Date/Time   WBC 3.9* 01/08/2016 1010   RBC 3.91* 01/08/2016 1010   HGB 11.7* 01/08/2016 1010   HCT 34.9* 01/08/2016 1010   PLT 304 01/08/2016 1010   MCV 89.3 01/08/2016 1010   MCH 29.9 01/08/2016 1010   MCHC 33.5 01/08/2016 1010   RDW 13.5 01/08/2016 1010   LYMPHSABS 1.0 01/05/2015 0938   MONOABS 0.5 01/05/2015 0938   EOSABS 0.1 01/05/2015 0938   BASOSABS 0.0 01/05/2015 0938      . CMP Latest Ref Rng 01/08/2016 01/05/2015 01/04/2014  Glucose 65 - 99 mg/dL 80 89 99  BUN 7 - 25 mg/dL 32(H) 41(H) 21  Creatinine 0.70 - 1.25 mg/dL 1.90(H) 1.62(H) 1.36(H)  Sodium 135 - 146 mmol/L 141 139 139  Potassium 3.5 - 5.3 mmol/L 3.8 4.0 3.7  Chloride 98 - 110 mmol/L 103 100 101  CO2 20 - 31 mmol/L 25 29 27   Calcium 8.6 - 10.3 mg/dL 10.0 9.9 10.2  Total Protein 6.1 - 8.1 g/dL 7.7 7.9 -  Total Bilirubin 0.2 - 1.2 mg/dL 0.6 0.5 -   Alkaline Phos 40 - 115 U/L 49 52 -  AST 10 - 35 U/L 22 19 -  ALT 9 - 46 U/L 16 16 18    . No results found for: IRON, TIBC, IRONPCTSAT (Iron and TIBC)  Lab Results  Component Value Date   FERRITIN 258 02/13/2016      RADIOGRAPHIC STUDIES: I have personally reviewed the radiological images as listed and agreed with the findings in the report. No results found.  ASSESSMENT & PLAN:   65 year old gentleman with  #1 normocytic normochromic anemia. Patient's anemia is very mild.  Likely related to his chronic  kidney disease. Cannot rule out an element of anemia from low testosterone levels- however would not be a good candidate for hormone replacement given his history of prostate cancer. Plan -peripheral blood smear shows no evidence of hemolysis.  No microspherocytes or increased schistocytes. -we will check erythropoietin level, serum protein electrophoresis to rule out plasma cell dyscrasia-pending results at this time. -ferritin level checked and is adequate at 258. -If patient becomes more anemic due to his chronic kidney disease and hemoglobin is less than 10 might consider treatment with ESA's. -No indication for specific treatment or transfusion at this time  #2 prostate cancer T1 C Gleason score 3+4 status post brachytherapy with radioactive seeds. -continue followup with urology.  #3 CKD likely related to HTN -has been referred by his PCP to see nephrology.  Continue f/u with PCP.  RTC with Dr Irene Limbo if any new questions or concerns arise or if he is noted to have worsening anemia/cytopenias.  All of the patients questions were answered to his apparent satisfaction. The patient knows to call the clinic with any problems, questions or concerns.  I spent 45 minutes counseling the patient face to face. The total time spent in the appointment was 50 minutes and more than 50% was on counseling and direct patient cares.    Sullivan Lone MD [place] AAHIVMS [facility]  [facility] Hematology/Oncology Physician [facility]  (Office):       [phone] (Work cell):  [phone] (Fax):           [phone]  02/13/2016 11:10 AM

## 2016-02-14 LAB — ERYTHROPOIETIN: Erythropoietin: 7 m[IU]/mL (ref 2.6–18.5)

## 2016-02-18 LAB — MULTIPLE MYELOMA PANEL, SERUM
Albumin SerPl Elph-Mcnc: 4.2 g/dL (ref 2.9–4.4)
Albumin/Glob SerPl: 1.3 (ref 0.7–1.7)
Alpha 1: 0.2 g/dL (ref 0.0–0.4)
Alpha2 Glob SerPl Elph-Mcnc: 0.8 g/dL (ref 0.4–1.0)
B-Globulin SerPl Elph-Mcnc: 1.2 g/dL (ref 0.7–1.3)
Gamma Glob SerPl Elph-Mcnc: 1.2 g/dL (ref 0.4–1.8)
Globulin, Total: 3.3 g/dL (ref 2.2–3.9)
IgA, Qn, Serum: 263 mg/dL (ref 61–437)
IgG, Qn, Serum: 1139 mg/dL (ref 700–1600)
IgM, Qn, Serum: 26 mg/dL (ref 20–172)
M Protein SerPl Elph-Mcnc: 0.3 g/dL — ABNORMAL HIGH
Total Protein: 7.5 g/dL (ref 6.0–8.5)

## 2016-02-25 ENCOUNTER — Other Ambulatory Visit: Payer: Self-pay | Admitting: Nephrology

## 2016-02-25 DIAGNOSIS — I159 Secondary hypertension, unspecified: Secondary | ICD-10-CM

## 2016-02-27 ENCOUNTER — Ambulatory Visit
Admission: RE | Admit: 2016-02-27 | Discharge: 2016-02-27 | Disposition: A | Discharge status: Home / Self Care | Payer: BLUE CROSS/BLUE SHIELD | Source: Ambulatory Visit | Attending: Nephrology | Admitting: Nephrology

## 2016-02-27 DIAGNOSIS — I159 Secondary hypertension, unspecified: Secondary | ICD-10-CM

## 2016-03-12 ENCOUNTER — Telehealth: Payer: Self-pay | Admitting: Hematology

## 2016-03-12 NOTE — Telephone Encounter (Signed)
Contacted Gloria from Dr. Moshe Cipro, faxed recent office note regarding Hematology concerns.

## 2016-07-07 DIAGNOSIS — I10 Essential (primary) hypertension: Secondary | ICD-10-CM | POA: Diagnosis not present

## 2016-07-07 DIAGNOSIS — N183 Chronic kidney disease, stage 3 (moderate): Secondary | ICD-10-CM | POA: Diagnosis not present

## 2016-07-07 DIAGNOSIS — D649 Anemia, unspecified: Secondary | ICD-10-CM | POA: Diagnosis not present

## 2016-07-07 DIAGNOSIS — E7800 Pure hypercholesterolemia, unspecified: Secondary | ICD-10-CM | POA: Diagnosis not present

## 2016-07-07 DIAGNOSIS — Z6830 Body mass index (BMI) 30.0-30.9, adult: Secondary | ICD-10-CM | POA: Diagnosis not present

## 2016-07-08 ENCOUNTER — Ambulatory Visit: Payer: BLUE CROSS/BLUE SHIELD | Admitting: Family Medicine

## 2016-07-10 ENCOUNTER — Ambulatory Visit (INDEPENDENT_AMBULATORY_CARE_PROVIDER_SITE_OTHER): Payer: Medicare Other | Admitting: Family Medicine

## 2016-07-10 ENCOUNTER — Encounter: Payer: Self-pay | Admitting: Family Medicine

## 2016-07-10 VITALS — BP 132/84 | HR 78 | Temp 98.0°F | Resp 17 | Ht 68.5 in | Wt 193.0 lb

## 2016-07-10 DIAGNOSIS — I10 Essential (primary) hypertension: Secondary | ICD-10-CM | POA: Diagnosis not present

## 2016-07-10 DIAGNOSIS — E785 Hyperlipidemia, unspecified: Secondary | ICD-10-CM

## 2016-07-10 DIAGNOSIS — N189 Chronic kidney disease, unspecified: Secondary | ICD-10-CM

## 2016-07-10 DIAGNOSIS — D638 Anemia in other chronic diseases classified elsewhere: Secondary | ICD-10-CM

## 2016-07-10 LAB — COMPLETE METABOLIC PANEL WITH GFR
ALT: 15 U/L (ref 9–46)
AST: 19 U/L (ref 10–35)
Albumin: 4.7 g/dL (ref 3.6–5.1)
Alkaline Phosphatase: 50 U/L (ref 40–115)
BUN: 34 mg/dL — ABNORMAL HIGH (ref 7–25)
CO2: 27 mmol/L (ref 20–31)
Calcium: 10.1 mg/dL (ref 8.6–10.3)
Chloride: 104 mmol/L (ref 98–110)
Creat: 1.89 mg/dL — ABNORMAL HIGH (ref 0.70–1.25)
GFR, Est African American: 42 mL/min — ABNORMAL LOW (ref >=60)
GFR, Est Non African American: 36 mL/min — ABNORMAL LOW (ref >=60)
Glucose, Bld: 81 mg/dL (ref 65–99)
Potassium: 4.2 mmol/L (ref 3.5–5.3)
Sodium: 141 mmol/L (ref 135–146)
Total Bilirubin: 0.6 mg/dL (ref 0.2–1.2)
Total Protein: 7.6 g/dL (ref 6.1–8.1)

## 2016-07-10 LAB — CBC
HCT: 37.1 % — ABNORMAL LOW (ref 38.5–50.0)
Hemoglobin: 12 g/dL — ABNORMAL LOW (ref 13.2–17.1)
MCH: 28.2 pg (ref 27.0–33.0)
MCHC: 32.3 g/dL (ref 32.0–36.0)
MCV: 87.3 fL (ref 80.0–100.0)
MPV: 10.6 fL (ref 7.5–12.5)
Platelets: 203 10*3/uL (ref 140–400)
RBC: 4.25 MIL/uL (ref 4.20–5.80)
RDW: 14.1 % (ref 11.0–15.0)
WBC: 4.5 10*3/uL (ref 3.8–10.8)

## 2016-07-10 LAB — LIPID PANEL
Cholesterol: 152 mg/dL (ref 125–200)
HDL: 74 mg/dL (ref >=40)
LDL Cholesterol: 69 mg/dL (ref <130)
Total CHOL/HDL Ratio: 2.1 Ratio (ref <=5.0)
Triglycerides: 43 mg/dL (ref <150)
VLDL: 9 mg/dL (ref <30)

## 2016-07-10 NOTE — Patient Instructions (Addendum)
     IF you received an x-ray today, you will receive an invoice from [facility] Radiology. Please contact [facility] Radiology at [phone] with questions or concerns regarding your invoice.   IF you received labwork today, you will receive an invoice from [employer]. Please contact [facility] at [phone] with questions or concerns regarding your invoice.   Our billing staff will not be able to assist you with questions regarding bills from these companies.  You will be contacted with the lab results as soon as they are available. The fastest way to get your results is to activate your My Chart account. Instructions are located on the last page of this paperwork. If you have not heard from [place] regarding the results in 2 weeks, please contact this office.    Nice meeting you today. I will check a blood count, other tests as requested by your nephrologist, kidney and liver tests, and cholesterol test. Sign up for my chart and your results will be available sooner. Let me know if you have any questions, and follow-up for a physical in 6 months.

## 2016-07-10 NOTE — Progress Notes (Signed)
By signing my name below, I, [name], attest that this documentation has been prepared under the direction and in the presence of [name], MD.  Electronically Signed: [name], Medical Scribe. 07/10/16. 1:23 PM.  Subjective:    Patient ID: Alan Shah, male    DOB: 1951/10/12, 65 y.o.   MRN: [id number]  HPI Chief Complaint  Patient presents with  . Follow-up    HTN    HPI Comments: Alan Shah is a 65 y.o. male who presents to the [facility] for HTN follow up. Pt has a PMHx of prostate CA followed by urologist Dr. Karsten Ro, anemia followed by Dr. Irene Limbo, also with chronic kidney disease likely due to his HTN. In regards to his anemia, it was mild and he had an adequate ferritin of 258 in March. No treatment needed unless hemoglobin less than 10. Pt has been fasting for today.  Prostate CA: Pt had radiation seed implant surgery for his prostate CA. Was dx in 2014.  HTN: He has been referred to nephrology in Feb this year and is followed by Dr. Jonnie Finner with [facility]. Dr. Jonnie Finner would like a SPEP on blood work from today and would like it to be faxed to [phone]. He takes HCTZ 25 mg QD and Verapomil 180 mg QD. Pt's bp has been around 120/70. Pt denies CP, SOB, light-headedness, HA, and unusual urinary symptoms. Pt denies experiencing any side effects while on his medication.  Lab Results  Component Value Date   CREATININE 1.9 (H) 02/13/2016   HLD: Takes Pravachol 40 mg QD. Pt denies experiencing any side effects on his medication. Lab Results  Component Value Date   CHOL 186 01/08/2016   HDL 78 01/08/2016   LDLCALC 96 01/08/2016   LDLDIRECT 70 07/06/2013   TRIG 58 01/08/2016   CHOLHDL 2.4 01/08/2016   Lab Results  Component Value Date   ALT 18 02/13/2016   AST 22 02/13/2016   ALKPHOS 50 02/13/2016   BILITOT 0.49 02/13/2016    Patient Active Problem List   Diagnosis Date Noted  . Anemia in chronic kidney disease  02/13/2016  . Stage T1c Prostate Cancer, Gleasons 3+4, PSA 8.2  s/p Prostate Seed Implant 09/08/13 10/21/2013  . Hypercholesteremia 01/10/2013  . Unspecified vitamin D deficiency 01/10/2013  . Hypertrophy of prostate without urinary obstruction and other lower urinary tract symptoms (LUTS) 01/10/2013  . HTN (hypertension) 06/01/2012  . Overweight (BMI 25.0-29.9) 06/01/2012   Past Medical History:  Diagnosis Date  . Hyperlipidemia   . Hypertension   . Prostate cancer [facility])    adenocarcinoma gleason 7   Past Surgical History:  Procedure Laterality Date  . COLONOSCOPY    . ORIF DISPLACED RIGHT TWO-PART PROXIMAL HUMEROUS FX  03-05-2010  . PROSTATE BIOPSY  02/24/2013  &   06-07-2013  . PROSTATE SURGERY    . RADIOACTIVE SEED IMPLANT N/A 09/08/2013   Procedure: RADIOACTIVE SEED IMPLANT;  Surgeon: Claybon Jabs, MD;  [facility]  Service: Urology;  Laterality: N/A;  . RIGHT SHOULDER SURGER FOR FX  2010   No Known Allergies Prior to Admission medications   Medication Sig Start Date End Date Taking? Authorizing Provider  aspirin 81 MG tablet Take 81 mg by mouth daily.   Yes Historical Provider, MD  hydrochlorothiazide (HYDRODIURIL) 25 MG tablet Take 1 tablet (25 mg total) by mouth daily. 01/08/16  Yes Elby Beck, FNP  Multiple Vitamin (MULTIVITAMIN) tablet Take 1  tablet by mouth daily.   Yes Historical Provider, MD  pravastatin (PRAVACHOL) 40 MG tablet Take 1 tablet (40 mg total) by mouth every morning. 01/08/16  Yes Elby Beck, FNP  verapamil (VERELAN PM) 180 MG 24 hr capsule Take 1 capsule (180 mg total) by mouth at bedtime. 01/08/16  Yes Elby Beck, FNP  CIALIS 20 MG tablet  12/12/15   Historical Provider, MD   Social History   Social History  . Marital status: Single    Spouse name: N/A  . Number of children: N/A  . Years of education: N/A   Occupational History  . RETIRED    Social History Main Topics  . Smoking status: Never Smoker  .  Smokeless tobacco: Never Used  . Alcohol use 0.6 oz/week    1 Standard drinks or equivalent per week     Comment: RARE  . Drug use: No  . Sexual activity: Yes   Other Topics Concern  . Not on file   Social History Narrative  . No narrative on file   Depression screen [facility] 2/9 07/10/2016 01/08/2016 01/05/2015 07/05/2014  Decreased Interest 0 0 0 0  Down, Depressed, Hopeless 0 0 0 0  PHQ - 2 Score 0 0 0 0   Review of Systems  Constitutional: Negative for fatigue and unexpected weight change.  Eyes: Negative for visual disturbance.  Respiratory: Negative for cough, chest tightness and shortness of breath.   Cardiovascular: Negative for chest pain, palpitations and leg swelling.  Gastrointestinal: Negative for abdominal pain and blood in stool.  Genitourinary: Negative for difficulty urinating, frequency, hematuria and urgency.  Neurological: Negative for dizziness, light-headedness and headaches.   Objective:  Physical Exam  Constitutional: He is oriented to person, place, and time. He appears well-developed and well-nourished.  HENT:  Head: Normocephalic and atraumatic.  Eyes: EOM are normal. Pupils are equal, round, and reactive to light.  Neck: No JVD present. Carotid bruit is not present.  Cardiovascular: Normal rate, regular rhythm and normal heart sounds.  Exam reveals no friction rub.   No murmur heard. Pulmonary/Chest: Effort normal and breath sounds normal. No respiratory distress. He has no wheezes. He has no rales.  Musculoskeletal: He exhibits no edema.  Neurological: He is alert and oriented to person, place, and time.  Skin: Skin is warm and dry.  Psychiatric: He has a normal mood and affect.  Vitals reviewed. BP 132/84 (BP Location: Left Arm, Patient Position: Sitting, Cuff Size: Normal)   Pulse 78   Temp 98 F (36.7 C) (Oral)   Resp 17   Ht 5' 8.5" (1.74 m)   Wt 193 lb (87.5 kg)   SpO2 99%   BMI 28.92 kg/m    Assessment & Plan:  Alan Shah is a 65 y.o.  male Essential hypertension - Plan: COMPLETE METABOLIC PANEL WITH GFR  -Stable. History of CKD. No change in medications for now, also followed by nephrologist. CMP pending, as well as SPEP at request of nephrologist for repeat testing.. Plan for fax of SPEP results to nephrologist.  Chronic kidney disease (CKD), unspecified stage - Plan: Protein electrophoresis, serum, COMPLETE METABOLIC PANEL WITH GFR  -As above. Continue follow-up with nephrology.  Hyperlipidemia - Plan: Lipid panel  Anemia of chronic disease - Plan: Protein electrophoresis, serum, CBC  -As above, no need for supplementation at this point. Has been evaluated by hematology.  No orders of the defined types were placed in this encounter.  Patient Instructions  IF you received an x-ray today, you will receive an invoice from [facility] Radiology. Please contact [facility] Radiology at [phone] with questions or concerns regarding your invoice.   IF you received labwork today, you will receive an invoice from [employer]. Please contact [facility] at [phone] with questions or concerns regarding your invoice.   Our billing staff will not be able to assist you with questions regarding bills from these companies.  You will be contacted with the lab results as soon as they are available. The fastest way to get your results is to activate your My Chart account. Instructions are located on the last page of this paperwork. If you have not heard from [place] regarding the results in 2 weeks, please contact this office.    Nice meeting you today. I will check a blood count, other tests as requested by your nephrologist, kidney and liver tests, and cholesterol test. Sign up for my chart and your results will be available sooner. Let me know if you have any questions, and follow-up for a physical in 6 months.     I personally performed the services described in this documentation, which was  scribed in my presence. The recorded information has been reviewed and considered, and addended by me as needed.   Signed,   [name], MD [facility].  07/11/16 10:31 PM

## 2016-07-14 LAB — PROTEIN ELECTROPHORESIS, SERUM
Abnormal Protein Band1: 0.4 g/dL
Abnormal Protein Band2: 0.2 g/dL
Abnormal Protein Band3: NOT DETECTED g/dL
Albumin ELP: 4.5 g/dL (ref 3.8–4.8)
Alpha-1-Globulin: 0.3 g/dL (ref 0.2–0.3)
Alpha-2-Globulin: 0.8 g/dL (ref 0.5–0.9)
Beta 2: 0.4 g/dL (ref 0.2–0.5)
Beta Globulin: 0.4 g/dL (ref 0.4–0.6)
Gamma Globulin: 1.3 g/dL (ref 0.8–1.7)
Total Protein, Serum Electrophoresis: 7.6 g/dL (ref 6.1–8.1)

## 2016-10-08 ENCOUNTER — Other Ambulatory Visit: Payer: Self-pay

## 2016-10-08 DIAGNOSIS — I10 Essential (primary) hypertension: Secondary | ICD-10-CM

## 2016-10-08 DIAGNOSIS — E7800 Pure hypercholesterolemia, unspecified: Secondary | ICD-10-CM

## 2016-10-08 MED ORDER — VERAPAMIL HCL ER 180 MG PO CP24: 180.0000 mg | ORAL_CAPSULE | Freq: Every day | ORAL | 1 refills | Status: DC

## 2016-10-08 MED ORDER — HYDROCHLOROTHIAZIDE 25 MG PO TABS: 25.0000 mg | ORAL_TABLET | Freq: Every day | ORAL | 1 refills | Status: DC

## 2016-10-08 MED ORDER — PRAVASTATIN SODIUM 40 MG PO TABS: 40.0000 mg | ORAL_TABLET | Freq: Every morning | ORAL | 1 refills | Status: DC

## 2016-12-11 DIAGNOSIS — Z8546 Personal history of malignant neoplasm of prostate: Secondary | ICD-10-CM | POA: Diagnosis not present

## 2017-01-02 ENCOUNTER — Other Ambulatory Visit: Payer: Self-pay | Admitting: Family Medicine

## 2017-01-02 DIAGNOSIS — E7800 Pure hypercholesterolemia, unspecified: Secondary | ICD-10-CM

## 2017-01-07 ENCOUNTER — Other Ambulatory Visit: Payer: Self-pay | Admitting: *Deleted

## 2017-01-07 DIAGNOSIS — I10 Essential (primary) hypertension: Secondary | ICD-10-CM

## 2017-01-07 MED ORDER — VERAPAMIL HCL ER 180 MG PO CP24: 180.0000 mg | ORAL_CAPSULE | Freq: Every day | ORAL | 0 refills | Status: DC

## 2017-01-07 MED ORDER — HYDROCHLOROTHIAZIDE 25 MG PO TABS: 25.0000 mg | ORAL_TABLET | Freq: Every day | ORAL | 0 refills | Status: DC

## 2017-01-08 DIAGNOSIS — E7800 Pure hypercholesterolemia, unspecified: Secondary | ICD-10-CM | POA: Diagnosis not present

## 2017-01-08 DIAGNOSIS — D649 Anemia, unspecified: Secondary | ICD-10-CM | POA: Diagnosis not present

## 2017-01-08 DIAGNOSIS — N183 Chronic kidney disease, stage 3 (moderate): Secondary | ICD-10-CM | POA: Diagnosis not present

## 2017-01-08 DIAGNOSIS — Z6830 Body mass index (BMI) 30.0-30.9, adult: Secondary | ICD-10-CM | POA: Diagnosis not present

## 2017-01-08 DIAGNOSIS — I10 Essential (primary) hypertension: Secondary | ICD-10-CM | POA: Diagnosis not present

## 2017-01-15 ENCOUNTER — Encounter: Payer: Medicare Other | Admitting: Family Medicine

## 2017-02-12 DIAGNOSIS — H2513 Age-related nuclear cataract, bilateral: Secondary | ICD-10-CM | POA: Diagnosis not present

## 2017-02-12 DIAGNOSIS — H40053 Ocular hypertension, bilateral: Secondary | ICD-10-CM | POA: Diagnosis not present

## 2017-02-12 DIAGNOSIS — H524 Presbyopia: Secondary | ICD-10-CM | POA: Diagnosis not present

## 2017-02-12 DIAGNOSIS — H25043 Posterior subcapsular polar age-related cataract, bilateral: Secondary | ICD-10-CM | POA: Diagnosis not present

## 2017-02-12 DIAGNOSIS — H04123 Dry eye syndrome of bilateral lacrimal glands: Secondary | ICD-10-CM | POA: Diagnosis not present

## 2017-02-25 ENCOUNTER — Other Ambulatory Visit: Payer: Self-pay | Admitting: Family Medicine

## 2017-02-25 DIAGNOSIS — I10 Essential (primary) hypertension: Secondary | ICD-10-CM

## 2017-02-26 ENCOUNTER — Encounter: Payer: Self-pay | Admitting: Family Medicine

## 2017-02-26 ENCOUNTER — Ambulatory Visit (INDEPENDENT_AMBULATORY_CARE_PROVIDER_SITE_OTHER): Payer: Medicare Other | Admitting: Family Medicine

## 2017-02-26 VITALS — BP 140/82 | HR 76 | Temp 98.6°F | Resp 18 | Ht 68.5 in | Wt 195.8 lb

## 2017-02-26 DIAGNOSIS — N189 Chronic kidney disease, unspecified: Secondary | ICD-10-CM

## 2017-02-26 DIAGNOSIS — H269 Unspecified cataract: Secondary | ICD-10-CM

## 2017-02-26 DIAGNOSIS — I10 Essential (primary) hypertension: Secondary | ICD-10-CM | POA: Diagnosis not present

## 2017-02-26 DIAGNOSIS — E785 Hyperlipidemia, unspecified: Secondary | ICD-10-CM | POA: Diagnosis not present

## 2017-02-26 DIAGNOSIS — Z0000 Encounter for general adult medical examination without abnormal findings: Secondary | ICD-10-CM | POA: Diagnosis not present

## 2017-02-26 DIAGNOSIS — Z23 Encounter for immunization: Secondary | ICD-10-CM | POA: Diagnosis not present

## 2017-02-26 DIAGNOSIS — E7800 Pure hypercholesterolemia, unspecified: Secondary | ICD-10-CM | POA: Diagnosis not present

## 2017-02-26 MED ORDER — HYDROCHLOROTHIAZIDE 25 MG PO TABS: 25.0000 mg | ORAL_TABLET | Freq: Every day | ORAL | 1 refills | Status: DC

## 2017-02-26 MED ORDER — VERAPAMIL HCL ER 180 MG PO CP24: 180.0000 mg | ORAL_CAPSULE | Freq: Every day | ORAL | 1 refills | Status: DC

## 2017-02-26 MED ORDER — PRAVASTATIN SODIUM 40 MG PO TABS: 40.0000 mg | ORAL_TABLET | Freq: Every morning | ORAL | 1 refills | Status: DC

## 2017-02-26 NOTE — Telephone Encounter (Signed)
Pt need appt with Dr Carlota Raspberry within 3 months

## 2017-02-26 NOTE — Progress Notes (Signed)
By signing my name below, I, [name], attest that this documentation has been prepared under the direction and in the presence of [name], MD.  Electronically Signed: Verlee Shah, Medical Scribe. 02/26/17. 11:14 AM.  Subjective:    Patient ID: Alan Shah, male    DOB: [birth year], 66 y.o.   MRN: [id number]  HPI Chief Complaint  Patient presents with  . Annual Exam    HPI Comments: Alan Shah is a 66 y.o. male who presents to the Primary Care at [facility] and [facility] for his welcome to medicare physical. He has a history of HTN, obesity, prostates CA (treated with radiation seed implant), HLD, and Vit D deficiency. Pt is fasting.  HTN with CKD: Takes HCTZ 25 mg QD, verapamil 180 mg QD. He is followed by Dr. Moshe Cipro with [facility]. Pt's bp has been ranging 125-135/69-85 and notes today is the first time it's been this high. Pt has 3-4 tabs of verapamil remaining and he's compliant with his medication without missing a dose. His nephrologist would like to get a copy of his renal function faxed to [phone] attn to Dr. Moshe Cipro.  Lab Results  Component Value Date   CREATININE 1.89 (H) 07/10/2016   BP Readings from Last 3 Encounters:  02/26/17 (!) 150/74  07/10/16 132/84  02/13/16 (!) 147/82   HLD: He is on pravachol 40 mg QD. Lab Results  Component Value Date   CHOL 152 07/10/2016   HDL 74 07/10/2016   LDLCALC 69 07/10/2016   LDLDIRECT 70 07/06/2013   TRIG 43 07/10/2016   CHOLHDL 2.1 07/10/2016   Lab Results  Component Value Date   ALT 15 07/10/2016   AST 19 07/10/2016   ALKPHOS 50 07/10/2016   BILITOT 0.6 07/10/2016   Vit D: No recent level. Last checked in 2014 but was nl at that time. Low reading was in 2013 at a reading of 14, treated with 50,000 units per week.  Cancer Screening: Prostate CA: Dx in 2014 and treated with radioactive seed implant. Urologist is Dr. Karsten Ro. Pt receives his PSA as well as his  DRE at his urologist. No results found for: PSA Colon: Colonoscopy Sept 2016 by Dr. Benson Norway. Polyps noted, repeat in 3-5 years. Plans on scheduling colonoscopy next year. Lung: Pt was never a smoker in the past and isn't a current smoker.  Immunizations: Pt agrees to get his pneumovax today. Declines flu vaccine. Immunization History  Administered Date(s) Administered  . Pneumococcal Conjugate-13 01/05/2015  . Tdap 04/25/2010  . Zoster 09/01/2011   Vision (hx of bilateral cataracts): Followed by Dr. Bing Plume with annual appts. He has a consultation next month to schedule his cataracts surgery.  Visual Acuity Screening   Right eye Left eye Both eyes  Without correction: 20/200 20/200 20/200  With correction:     Comments: Cataracts  Dentist: Has both a dentist and periodontist with biannual visits between the both of them,  Exercise: 2 hours daily; 1 hour on the treadmill and 1 hour on the bike. Denies chest pain, hip/knee pain while exercising.  Depression Screening: Denies dysphoric mood. Depression screen [facility] 2/9 02/26/2017 07/10/2016 01/08/2016 01/05/2015 07/05/2014  Decreased Interest 0 0 0 0 0  Down, Depressed, Hopeless 0 0 0 0 0  PHQ - 2 Score 0 0 0 0 0   HIV/Hep C Screening: The former has been done in Jan 2000 with negative results while he was in [employer], and the latter was completed in  2015.  Advance Directives: Pt does not have an advance directive but information was given today.  Fall Screening: Fall Risk  02/26/2017 07/10/2016 01/08/2016 01/05/2015  Falls in the past year? No No No No   Functional Status Survey: Vision: Pt has cataracts and wears corrective lenses. Is the patient deaf or have difficulty hearing?: No Does the patient have difficulty seeing, even when wearing glasses/contacts?: Yes Does the patient have difficulty concentrating, remembering, or making decisions?: No Does the patient have difficulty walking or climbing stairs?: No Does the patient have  difficulty dressing or bathing?: No Does the patient have difficulty doing errands alone such as visiting a doctor's office or shopping?: No  Patient Active Problem List   Diagnosis Date Noted  . Anemia in chronic kidney disease 02/13/2016  . Stage T1c Prostate Cancer, Gleasons 3+4, PSA 8.2  s/p Prostate Seed Implant 09/08/13 10/21/2013  . Hypercholesteremia 01/10/2013  . Unspecified vitamin D deficiency 01/10/2013  . Hypertrophy of prostate without urinary obstruction and other lower urinary tract symptoms (LUTS) 01/10/2013  . HTN (hypertension) 06/01/2012  . Overweight (BMI 25.0-29.9) 06/01/2012   Past Medical History:  Diagnosis Date  . Hyperlipidemia   . Hypertension   . Prostate cancer [facility])    adenocarcinoma gleason 7   Past Surgical History:  Procedure Laterality Date  . COLONOSCOPY    . ORIF DISPLACED RIGHT TWO-PART PROXIMAL HUMEROUS FX  03-05-2010  . PROSTATE BIOPSY  02/24/2013  &   06-07-2013  . PROSTATE SURGERY    . RADIOACTIVE SEED IMPLANT N/A 09/08/2013   Procedure: RADIOACTIVE SEED IMPLANT;  Surgeon: Claybon Jabs, MD;  Location: [facility];  Service: Urology;  Laterality: N/A;  . RIGHT SHOULDER SURGER FOR FX  2010   No Known Allergies Prior to Admission medications   Medication Sig Start Date End Date Taking? Authorizing Provider  aspirin 81 MG tablet Take 81 mg by mouth daily.   Yes Historical Provider, MD  CIALIS 20 MG tablet  12/12/15  Yes Historical Provider, MD  Multiple Vitamin (MULTIVITAMIN) tablet Take 1 tablet by mouth daily.   Yes Historical Provider, MD  pravastatin (PRAVACHOL) 40 MG tablet Take 1 tablet (40 mg total) by mouth every morning. 10/08/16  Yes Wendie Agreste, MD  verapamil (VERELAN PM) 180 MG 24 hr capsule Take 1 capsule (180 mg total) by mouth at bedtime. 01/07/17  Yes Wendie Agreste, MD  hydrochlorothiazide (HYDRODIURIL) 25 MG tablet TAKE 1 TABLET BY MOUTH  DAILY 02/26/17   Mancel Bale, PA-C   Social History   Social  History  . Marital status: Single    Spouse name: N/A  . Number of children: N/A  . Years of education: N/A   Occupational History  . RETIRED    Social History Main Topics  . Smoking status: Never Smoker  . Smokeless tobacco: Never Used  . Alcohol use 0.6 oz/week    1 Standard drinks or equivalent per week     Comment: RARE  . Drug use: No  . Sexual activity: Yes   Other Topics Concern  . Not on file   Social History Narrative  . No narrative on file   Review of Systems  13 point ROS was negative except vision with cataracts. Objective:  Physical Exam  Constitutional: He is oriented to person, place, and time. He appears well-developed and well-nourished.  HENT:  Head: Normocephalic and atraumatic.  Right Ear: External ear normal.  Left Ear: External ear normal.  Mouth/Throat:  Oropharynx is clear and moist.  Eyes: Conjunctivae and EOM are normal. Pupils are equal, round, and reactive to light.  Neck: Normal range of motion. Neck supple. No thyromegaly present.  Cardiovascular: Normal rate, regular rhythm, normal heart sounds and intact distal pulses.   Pulmonary/Chest: Effort normal and breath sounds normal. No respiratory distress. He has no wheezes.  Abdominal: Soft. He exhibits no distension. There is no tenderness.  Genitourinary:  Genitourinary Comments: GU exam deferred with follow-up at urology.  Musculoskeletal: Normal range of motion. He exhibits no edema or tenderness.  Lymphadenopathy:    He has no cervical adenopathy.  Neurological: He is alert and oriented to person, place, and time. He has normal reflexes.  Skin: Skin is warm and dry.  Psychiatric: He has a normal mood and affect. His behavior is normal.  Vitals reviewed.   Vitals:   02/26/17 1023  BP: (!) 150/74  Pulse: 76  Resp: 18  Temp: 98.6 F (37 C)  TempSrc: Oral  SpO2: 97%  Weight: 195 lb 12.8 oz (88.8 kg)  Height: 5' 8.5" (1.74 m)   Body mass index is 29.34 kg/m. Assessment & Plan:     TAVIOUS GRIESINGER is a 66 y.o. male Medicare welcome exam  -  - anticipatory guidance as below in AVS, screening labs if needed. Health maintenance items as above in HPI discussed/recommended as applicable.   - no concerning responses on depression, fall, or functional status screening. Any positive responses noted as above. Advanced directives discussed as in CHL.   Need for vaccination against Streptococcus pneumoniae - Plan: Pneumococcal polysaccharide vaccine 23-valent greater than or equal to 2yo subcutaneous/IM  Essential hypertension - Plan: Comprehensive metabolic panel, hydrochlorothiazide (HYDRODIURIL) 25 MG tablet, verapamil (VERELAN PM) 180 MG 24 hr capsule  - Borderline control. Followed by nephrology for chronic kidney disease. Plan on faxing copy of labs to that provider once ready.  - If pressures remain elevated outside of office, contact myself or nephrologist to discuss change in meds.  Cataract of both eyes, unspecified cataract type  - Follow-up with ophthalmology as planned  Chronic kidney disease, unspecified CKD stage - Plan: Comprehensive metabolic panel  - As above, labs obtained. Continue routine follow-up with Dr. Moshe Cipro.  Hyperlipidemia, unspecified hyperlipidemia type - Plan: Lipid panel Hypercholesteremia - Plan: pravastatin (PRAVACHOL) 40 MG tablet  - Tolerating Pravachol. Continue same dose, with repeat lipid panel.    Meds ordered this encounter  Medications  . hydrochlorothiazide (HYDRODIURIL) 25 MG tablet    Sig: Take 1 tablet (25 mg total) by mouth daily.    Dispense:  90 tablet    Refill:  1  . pravastatin (PRAVACHOL) 40 MG tablet    Sig: Take 1 tablet (40 mg total) by mouth every morning.    Dispense:  90 tablet    Refill:  1  . verapamil (VERELAN PM) 180 MG 24 hr capsule    Sig: Take 1 capsule (180 mg total) by mouth at bedtime.    Dispense:  90 capsule    Refill:  1   Patient Instructions    Once bloodwork is back, we can fax it to  Dr. Moshe Cipro.   Keep follow up with Dr. Bing Plume.   No change in medications for now, but if unable to authorize verapamil, may need to look at other options.  Recheck in 6 months.   Keeping you healthy  Get these tests  Blood pressure- Have your blood pressure checked once a year by your healthcare  provider.  Normal blood pressure is 120/80  Weight- Have your body mass index (BMI) calculated to screen for obesity.  BMI is a measure of body fat based on height and weight. You can also calculate your own BMI at [email or web address].  Cholesterol- Have your cholesterol checked every year.  Diabetes- Have your blood sugar checked regularly if you have high blood pressure, high cholesterol, have a family history of diabetes or if you are overweight.  Screening for Colon Cancer- Colonoscopy starting at age 57.  Screening may begin sooner depending on your family history and other health conditions. Follow up colonoscopy as directed by your Gastroenterologist.  Screening for Prostate Cancer- Both blood work (PSA) and a rectal exam help screen for Prostate Cancer.  Screening begins at age 6 with African-American men and at age 42 with Caucasian men.  Screening may begin sooner depending on your family history.  Take these medicines  Aspirin- One aspirin daily can help prevent Heart disease and Stroke.  Flu shot- Every fall.  Tetanus- Every 10 years.  Zostavax- Once after the age of 77 to prevent Shingles.  Pneumonia shot- Once after the age of 66; if you are younger than 42, ask your healthcare provider if you need a Pneumonia shot.  Take these steps  Don't smoke- If you do smoke, talk to your doctor about quitting.  For tips on how to quit, go to www.smokefree.gov or call 1-800-QUIT-NOW.  Be physically active- Exercise 5 days a week for at least 30 minutes.  If you are not already physically active start slow and gradually work up to 30 minutes of moderate physical activity.   Examples of moderate activity include walking briskly, mowing the yard, dancing, swimming, bicycling, etc.  Eat a healthy diet- Eat a variety of healthy food such as fruits, vegetables, low fat milk, low fat cheese, yogurt, lean meant, poultry, fish, beans, tofu, etc. For more information go to www.thenutritionsource.org  Drink alcohol in moderation- Limit alcohol intake to less than two drinks a day. Never drink and drive.  Dentist- Brush and floss twice daily; visit your dentist twice a year.  Depression- Your emotional health is as important as your physical health. If you're feeling down, or losing interest in things you would normally enjoy please talk to your healthcare provider.  Eye exam- Visit your eye doctor every year.  Safe sex- If you may be exposed to a sexually transmitted infection, use a condom.  Seat belts- Seat belts can save your life; always wear one.  Smoke/Carbon Monoxide detectors- These detectors need to be installed on the appropriate level of your home.  Replace batteries at least once a year.  Skin cancer- When out in the sun, cover up and use sunscreen 15 SPF or higher.  Violence- If anyone is threatening you, please tell your healthcare provider.  Living Will/ Health care power of attorney- Speak with your healthcare provider and family.   IF you received an x-ray today, you will receive an invoice from [facility]. Please contact [facility] at [phone] with questions or concerns regarding your invoice.   IF you received labwork today, you will receive an invoice from [place]. Please contact [facility] at [phone] with questions or concerns regarding your invoice.   Our billing staff will not be able to assist you with questions regarding bills from these companies.  You will be contacted with the lab results as soon as they are available. The fastest way to get your results is to activate your  My Chart account. Instructions are  located on the last page of this paperwork. If you have not heard from [place] regarding the results in 2 weeks, please contact this office.       I personally performed the services described in this documentation, which was scribed in my presence. The recorded information has been reviewed and considered for accuracy and completeness, addended by me as needed, and agree with information above.  Signed,   [name], MD [facility] at [facility].  03/01/17 2:12 PM

## 2017-02-26 NOTE — Patient Instructions (Addendum)
Once bloodwork is back, we can fax it to Dr. Moshe Cipro.   Keep follow up with Dr. Bing Plume.   No change in medications for now, but if unable to authorize verapamil, may need to look at other options.  Recheck in 6 months.   Keeping you healthy  Get these tests  Blood pressure- Have your blood pressure checked once a year by your healthcare provider.  Normal blood pressure is 120/80  Weight- Have your body mass index (BMI) calculated to screen for obesity.  BMI is a measure of body fat based on height and weight. You can also calculate your own BMI at [email or web address].  Cholesterol- Have your cholesterol checked every year.  Diabetes- Have your blood sugar checked regularly if you have high blood pressure, high cholesterol, have a family history of diabetes or if you are overweight.  Screening for Colon Cancer- Colonoscopy starting at age 66.  Screening may begin sooner depending on your family history and other health conditions. Follow up colonoscopy as directed by your Gastroenterologist.  Screening for Prostate Cancer- Both blood work (PSA) and a rectal exam help screen for Prostate Cancer.  Screening begins at age 66 with African-American men and at age 66 with Caucasian men.  Screening may begin sooner depending on your family history.  Take these medicines  Aspirin- One aspirin daily can help prevent Heart disease and Stroke.  Flu shot- Every fall.  Tetanus- Every 10 years.  Zostavax- Once after the age of 38 to prevent Shingles.  Pneumonia shot- Once after the age of 66; if you are younger than 10, ask your healthcare provider if you need a Pneumonia shot.  Take these steps  Don't smoke- If you do smoke, talk to your doctor about quitting.  For tips on how to quit, go to www.smokefree.gov or call 1-800-QUIT-NOW.  Be physically active- Exercise 5 days a week for at least 30 minutes.  If you are not already physically active start slow and gradually work up to  30 minutes of moderate physical activity.  Examples of moderate activity include walking briskly, mowing the yard, dancing, swimming, bicycling, etc.  Eat a healthy diet- Eat a variety of healthy food such as fruits, vegetables, low fat milk, low fat cheese, yogurt, lean meant, poultry, fish, beans, tofu, etc. For more information go to www.thenutritionsource.org  Drink alcohol in moderation- Limit alcohol intake to less than two drinks a day. Never drink and drive.  Dentist- Brush and floss twice daily; visit your dentist twice a year.  Depression- Your emotional health is as important as your physical health. If you're feeling down, or losing interest in things you would normally enjoy please talk to your healthcare provider.  Eye exam- Visit your eye doctor every year.  Safe sex- If you may be exposed to a sexually transmitted infection, use a condom.  Seat belts- Seat belts can save your life; always wear one.  Smoke/Carbon Monoxide detectors- These detectors need to be installed on the appropriate level of your home.  Replace batteries at least once a year.  Skin cancer- When out in the sun, cover up and use sunscreen 15 SPF or higher.  Violence- If anyone is threatening you, please tell your healthcare provider.  Living Will/ Health care power of attorney- Speak with your healthcare provider and family.   IF you received an x-ray today, you will receive an invoice from [facility]. Please contact [facility] at [phone] with questions or concerns regarding your invoice.   IF  you received labwork today, you will receive an invoice from [place]. Please contact [facility] at [phone] with questions or concerns regarding your invoice.   Our billing staff will not be able to assist you with questions regarding bills from these companies.  You will be contacted with the lab results as soon as they are available. The fastest way to get your results is to  activate your My Chart account. Instructions are located on the last page of this paperwork. If you have not heard from [place] regarding the results in 2 weeks, please contact this office.

## 2017-02-27 LAB — COMPREHENSIVE METABOLIC PANEL
ALT: 20 IU/L (ref 0–44)
AST: 28 IU/L (ref 0–40)
Albumin/Globulin Ratio: 1.7 (ref 1.2–2.2)
Albumin: 4.9 g/dL — ABNORMAL HIGH (ref 3.6–4.8)
Alkaline Phosphatase: 58 IU/L (ref 39–117)
BUN/Creatinine Ratio: 17 (ref 10–24)
BUN: 32 mg/dL — ABNORMAL HIGH (ref 8–27)
Bilirubin Total: 0.6 mg/dL (ref 0.0–1.2)
CO2: 23 mmol/L (ref 18–29)
Calcium: 9.9 mg/dL (ref 8.6–10.2)
Chloride: 100 mmol/L (ref 96–106)
Creatinine, Ser: 1.88 mg/dL — ABNORMAL HIGH (ref 0.76–1.27)
GFR calc Af Amer: 42 mL/min/{1.73_m2} — ABNORMAL LOW (ref >59)
GFR calc non Af Amer: 37 mL/min/{1.73_m2} — ABNORMAL LOW (ref >59)
Globulin, Total: 2.9 g/dL (ref 1.5–4.5)
Glucose: 87 mg/dL (ref 65–99)
Potassium: 3.9 mmol/L (ref 3.5–5.2)
Sodium: 140 mmol/L (ref 134–144)
Total Protein: 7.8 g/dL (ref 6.0–8.5)

## 2017-02-27 LAB — LIPID PANEL
Chol/HDL Ratio: 2.2 ratio units (ref 0.0–5.0)
Cholesterol, Total: 167 mg/dL (ref 100–199)
HDL: 75 mg/dL (ref >39)
LDL Calculated: 84 mg/dL (ref 0–99)
Triglycerides: 42 mg/dL (ref 0–149)
VLDL Cholesterol Cal: 8 mg/dL (ref 5–40)

## 2017-03-07 ENCOUNTER — Other Ambulatory Visit: Payer: Self-pay

## 2017-03-07 MED ORDER — VERAPAMIL HCL ER 180 MG PO TBCR: 180.0000 mg | EXTENDED_RELEASE_TABLET | Freq: Every day | ORAL | 1 refills | Status: DC

## 2017-04-15 DIAGNOSIS — H04123 Dry eye syndrome of bilateral lacrimal glands: Secondary | ICD-10-CM | POA: Diagnosis not present

## 2017-04-15 DIAGNOSIS — H2513 Age-related nuclear cataract, bilateral: Secondary | ICD-10-CM | POA: Diagnosis not present

## 2017-04-15 DIAGNOSIS — H524 Presbyopia: Secondary | ICD-10-CM | POA: Diagnosis not present

## 2017-04-15 DIAGNOSIS — H40053 Ocular hypertension, bilateral: Secondary | ICD-10-CM | POA: Diagnosis not present

## 2017-04-15 DIAGNOSIS — H25043 Posterior subcapsular polar age-related cataract, bilateral: Secondary | ICD-10-CM | POA: Diagnosis not present

## 2017-05-18 DIAGNOSIS — H2511 Age-related nuclear cataract, right eye: Secondary | ICD-10-CM | POA: Diagnosis not present

## 2017-05-21 IMAGING — US US RENAL
1 series · 14 of 24 positions shown · non-contrast
Comparison: None

CLINICAL DATA: Secondary hypertension

EXAM:
RENAL / URINARY TRACT ULTRASOUND COMPLETE

[Series 1: us renal · 0.30mm/px · 14 of 24 slices shown]
[im 1/24]
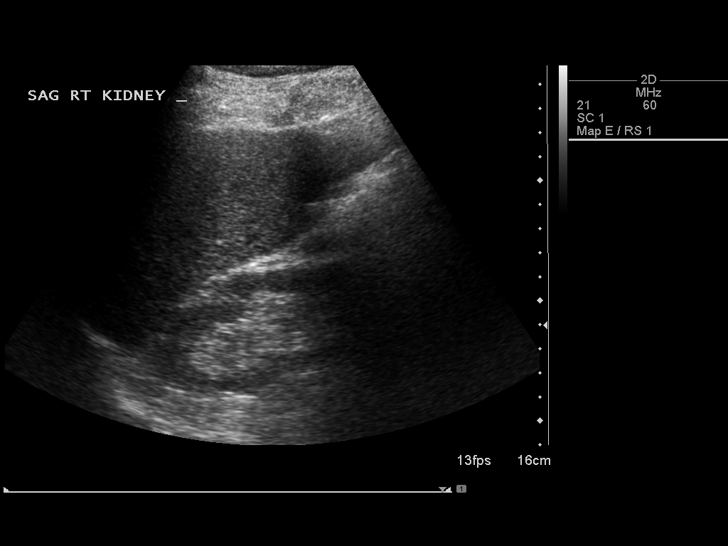
[im 3/24]
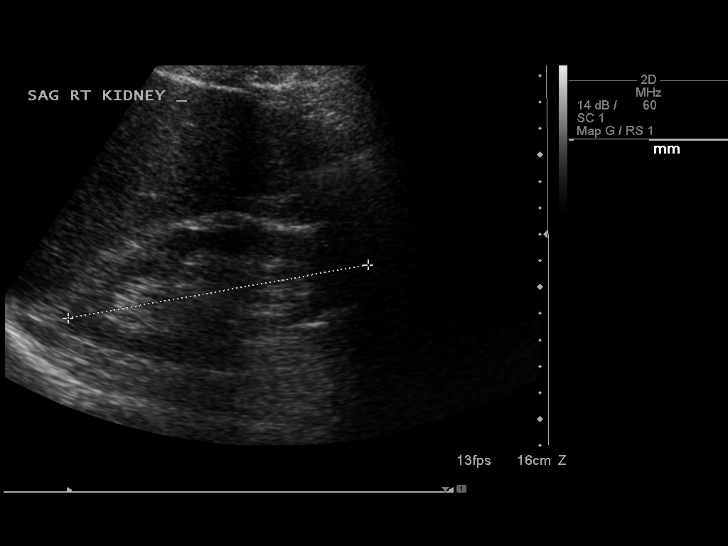
[im 5/24]
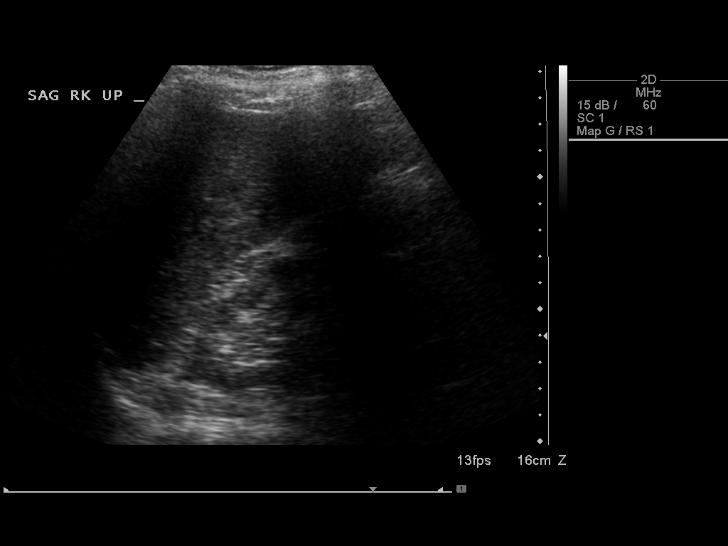
[im 7/24]
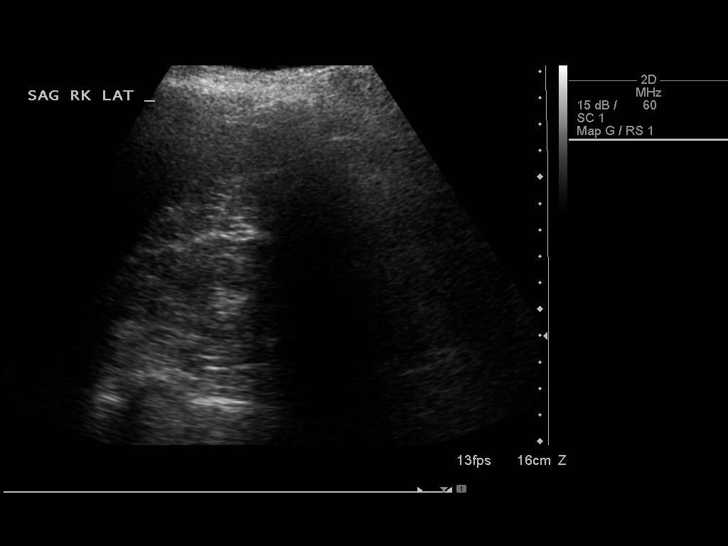
[im 8/24]
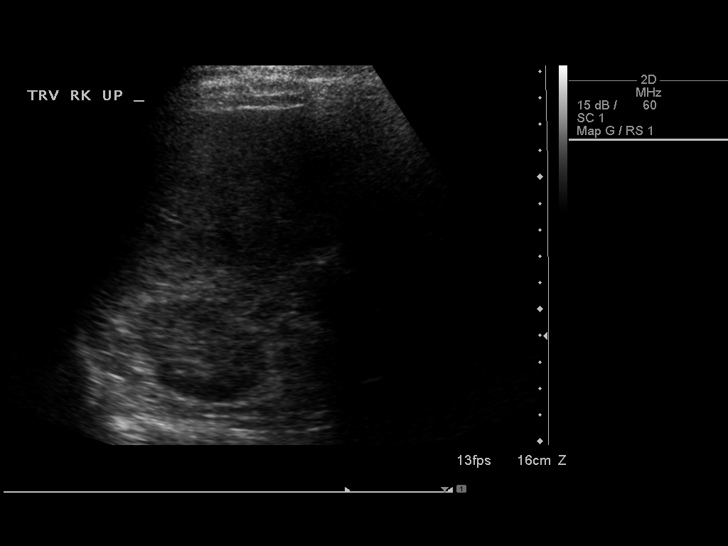
[im 10/24]
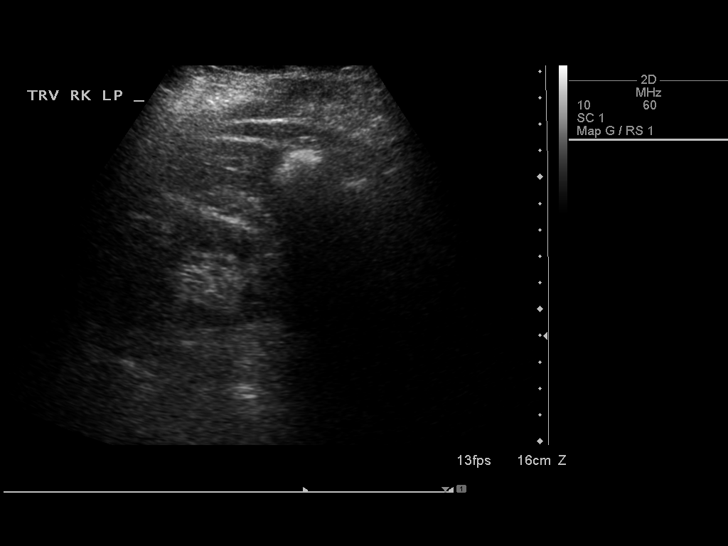
[im 12/24]
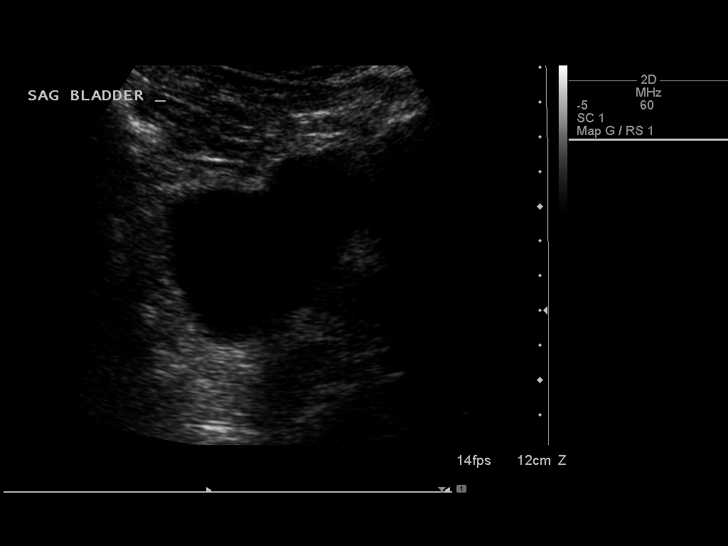
[im 13/24]
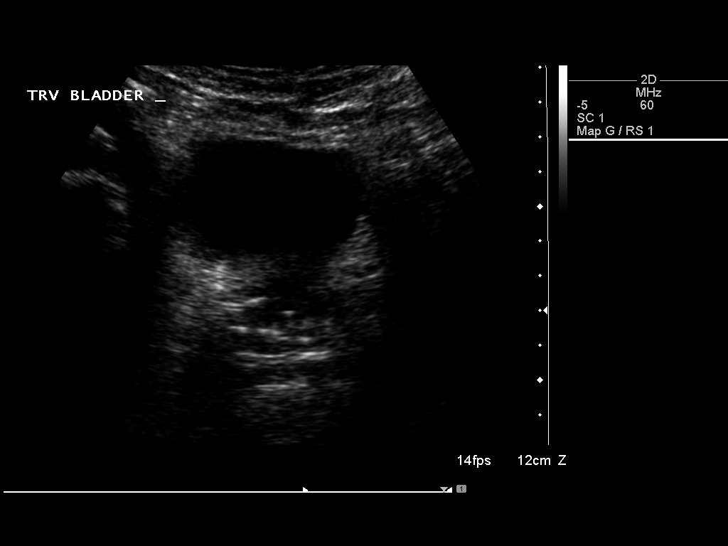
[im 15/24]
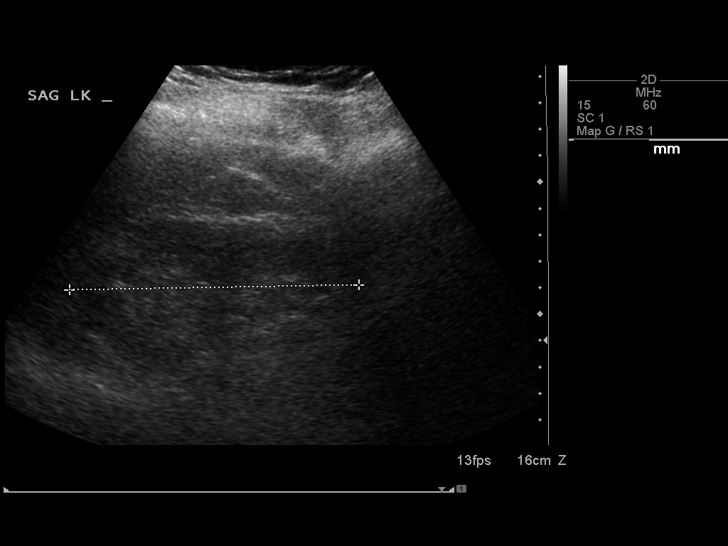
[im 17/24]
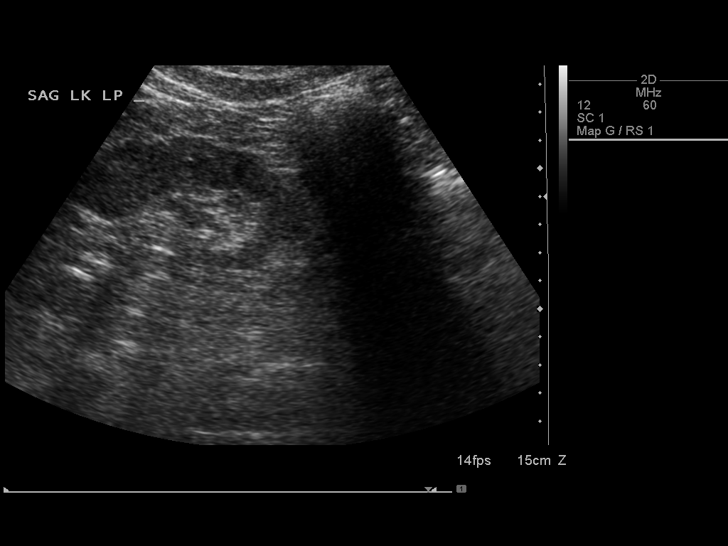
[im 19/24]
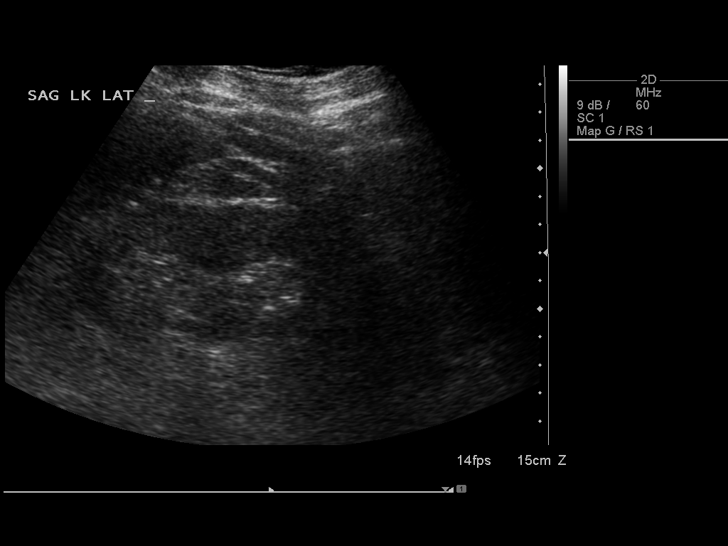
[im 20/24]
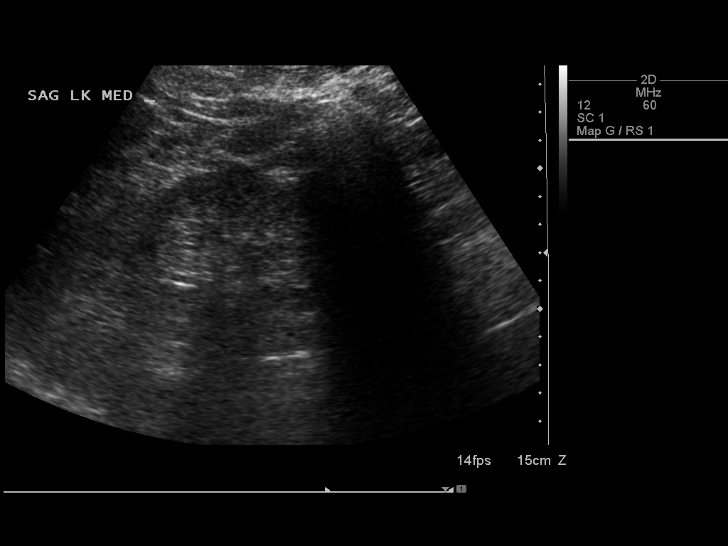
[im 22/24]
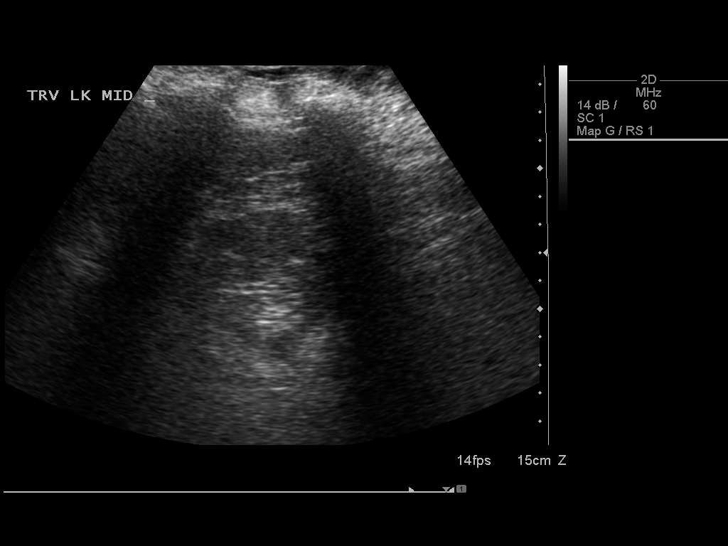
[im 24/24]
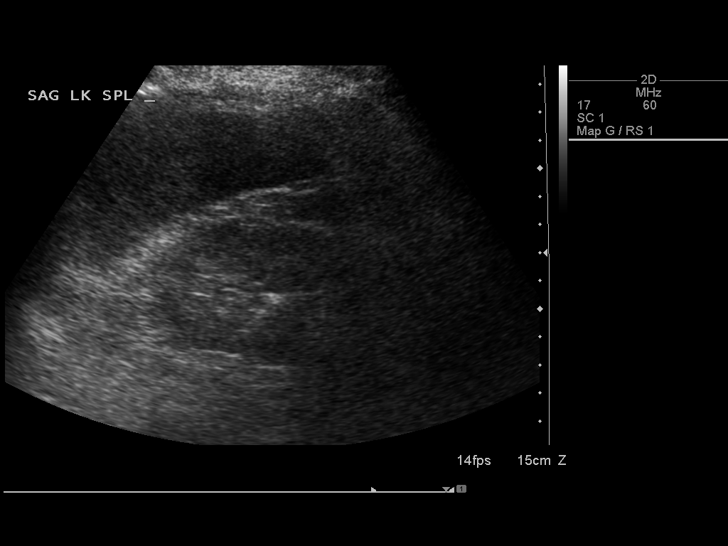

[14 of 24 positions shown; findings below may reference images not displayed]

FINDINGS: Right Kidney:

Length: 11.7 cm. Cortical thinning. Normal cortical echogenicity. No
mass, hydronephrosis or shadowing calcification.

Left Kidney:

Length: 11.1 cm. Cortical thinning. Increased cortical echogenicity.
Upper pole less well visualized due to body habitus. No gross mass,
hydronephrosis, or shadowing calcification.

Bladder:

Appears normal for degree of bladder distention.
IMPRESSION: BILATERAL mild renal cortical atrophy.

Otherwise normal exam.

## 2017-05-25 DIAGNOSIS — H25811 Combined forms of age-related cataract, right eye: Secondary | ICD-10-CM | POA: Diagnosis not present

## 2017-05-25 DIAGNOSIS — H2511 Age-related nuclear cataract, right eye: Secondary | ICD-10-CM | POA: Diagnosis not present

## 2017-06-01 DIAGNOSIS — H2512 Age-related nuclear cataract, left eye: Secondary | ICD-10-CM | POA: Diagnosis not present

## 2017-06-15 DIAGNOSIS — H2512 Age-related nuclear cataract, left eye: Secondary | ICD-10-CM | POA: Diagnosis not present

## 2017-06-15 DIAGNOSIS — H25812 Combined forms of age-related cataract, left eye: Secondary | ICD-10-CM | POA: Diagnosis not present

## 2017-07-20 ENCOUNTER — Other Ambulatory Visit: Payer: Self-pay | Admitting: Family Medicine

## 2017-09-03 ENCOUNTER — Encounter: Payer: Self-pay | Admitting: Family Medicine

## 2017-09-03 ENCOUNTER — Ambulatory Visit (INDEPENDENT_AMBULATORY_CARE_PROVIDER_SITE_OTHER): Payer: Medicare Other | Admitting: Family Medicine

## 2017-09-03 VITALS — BP 126/62 | HR 74 | Temp 98.8°F | Resp 16 | Ht 68.5 in | Wt 193.4 lb

## 2017-09-03 DIAGNOSIS — N183 Chronic kidney disease, stage 3 unspecified: Secondary | ICD-10-CM

## 2017-09-03 DIAGNOSIS — I10 Essential (primary) hypertension: Secondary | ICD-10-CM

## 2017-09-03 DIAGNOSIS — E785 Hyperlipidemia, unspecified: Secondary | ICD-10-CM

## 2017-09-03 DIAGNOSIS — E7800 Pure hypercholesterolemia, unspecified: Secondary | ICD-10-CM | POA: Diagnosis not present

## 2017-09-03 MED ORDER — VERAPAMIL HCL ER 180 MG PO TBCR: 180.0000 mg | EXTENDED_RELEASE_TABLET | Freq: Every day | ORAL | 1 refills | Status: DC

## 2017-09-03 MED ORDER — PRAVASTATIN SODIUM 40 MG PO TABS: 40.0000 mg | ORAL_TABLET | Freq: Every morning | ORAL | 1 refills | Status: DC

## 2017-09-03 MED ORDER — HYDROCHLOROTHIAZIDE 25 MG PO TABS: 25.0000 mg | ORAL_TABLET | Freq: Every day | ORAL | 1 refills | Status: DC

## 2017-09-03 NOTE — Patient Instructions (Addendum)
Blood pressure looks good. I will check some blood work, but no med changes at this time. Keep follow up with nephrologist, but follow up with me in 6 months. Thanks for coming in today.     IF you received an x-ray today, you will receive an invoice from [facility]. Please contact [facility] at [phone] with questions or concerns regarding your invoice.   IF you received labwork today, you will receive an invoice from [place]. Please contact [facility] at [phone] with questions or concerns regarding your invoice.   Our billing staff will not be able to assist you with questions regarding bills from these companies.  You will be contacted with the lab results as soon as they are available. The fastest way to get your results is to activate your My Chart account. Instructions are located on the last page of this paperwork. If you have not heard from [place] regarding the results in 2 weeks, please contact this office.

## 2017-09-03 NOTE — Progress Notes (Signed)
Subjective:  By signing my name below, I, Moises Blood, attest that this documentation has been prepared under the direction and in the presence of [name], MD. Electronically Signed: Moises Blood, [place]. 09/03/2017 , 8:40 AM .  Patient was seen in Room 11 .   Patient ID: Alan Shah, male    DOB: 09-08-1951, 66 y.o.   MRN: [id number] Chief Complaint  Patient presents with  . Follow-up    hypertension    HPI Alan Shah is a 66 y.o. male Here for follow up. He is fasting today.   He usually doesn't receive flu shots; and will defer flu shot today.   HTN with history of Chronic kidney disease He's followed by Dr. Moshe Cipro at [facility]. He's on HCTZ 25mg  QD, and verapamil 180mg  QD.   BP Readings from Last 3 Encounters:  09/03/17 126/62  02/26/17 140/82  07/10/16 132/84   His BP home readings have been running around 125/75 at home. He denies chest pain, shortness of breath, swelling, lightheadedness or dizziness. He states his next appointment with Dr. Moshe Cipro is in February. He has GFR of 42. He's been drinking plenty of water daily.   Lab Results  Component Value Date   CREATININE 1.88 (H) 02/26/2017    Hyperlipidemia Lab Results  Component Value Date   CHOL 167 02/26/2017   HDL 75 02/26/2017   LDLCALC 84 02/26/2017   LDLDIRECT 70 07/06/2013   TRIG 42 02/26/2017   CHOLHDL 2.2 02/26/2017   Lab Results  Component Value Date   ALT 20 02/26/2017   AST 28 02/26/2017   ALKPHOS 58 02/26/2017   BILITOT 0.6 02/26/2017   He takes Pravachol 40mg  QD.   Patient Active Problem List   Diagnosis Date Noted  . Anemia in chronic kidney disease 02/13/2016  . Stage T1c Prostate Cancer, Gleasons 3+4, PSA 8.2  s/p Prostate Seed Implant 09/08/13 10/21/2013  . Hypercholesteremia 01/10/2013  . Unspecified vitamin D deficiency 01/10/2013  . Hypertrophy of prostate without urinary obstruction and other lower urinary tract symptoms (LUTS) 01/10/2013  . HTN  (hypertension) 06/01/2012  . Overweight (BMI 25.0-29.9) 06/01/2012   Past Medical History:  Diagnosis Date  . Hyperlipidemia   . Hypertension   . Prostate cancer [facility])    adenocarcinoma gleason 7   Past Surgical History:  Procedure Laterality Date  . COLONOSCOPY    . ORIF DISPLACED RIGHT TWO-PART PROXIMAL HUMEROUS FX  03-05-2010  . PROSTATE BIOPSY  02/24/2013  &   06-07-2013  . PROSTATE SURGERY    . RADIOACTIVE SEED IMPLANT N/A 09/08/2013   Procedure: RADIOACTIVE SEED IMPLANT;  Surgeon: Claybon Jabs, MD;  Location: [facility];  Service: Urology;  Laterality: N/A;  . RIGHT SHOULDER SURGER FOR FX  2010   No Known Allergies Prior to Admission medications   Medication Sig Start Date End Date Taking? Authorizing Provider  aspirin 81 MG tablet Take 81 mg by mouth daily.   Yes [provider]  CIALIS 20 MG tablet  12/12/15  Yes [provider]  hydrochlorothiazide (HYDRODIURIL) 25 MG tablet Take 1 tablet (25 mg total) by mouth daily. 02/26/17  Yes Wendie Agreste, MD  Multiple Vitamin (MULTIVITAMIN) tablet Take 1 tablet by mouth daily.   Yes [provider]  pravastatin (PRAVACHOL) 40 MG tablet Take 1 tablet (40 mg total) by mouth every morning. 02/26/17  Yes Wendie Agreste, MD  verapamil (CALAN-SR) 180 MG CR tablet TAKE 1 TABLET BY MOUTH AT  BEDTIME 07/22/17  Yes Wendie Agreste, MD   Social History   Social History  . Marital status: Single    Spouse name: N/A  . Number of children: N/A  . Years of education: N/A   Occupational History  . RETIRED    Social History Main Topics  . Smoking status: Never Smoker  . Smokeless tobacco: Never Used  . Alcohol use 0.6 oz/week    1 Standard drinks or equivalent per week     Comment: RARE  . Drug use: No  . Sexual activity: Yes   Other Topics Concern  . Not on file   Social History Narrative  . No narrative on file   Review of Systems  Constitutional: Negative for fatigue and  unexpected weight change.  Eyes: Negative for visual disturbance.  Respiratory: Negative for cough, chest tightness and shortness of breath.   Cardiovascular: Negative for chest pain, palpitations and leg swelling.  Gastrointestinal: Negative for abdominal pain and blood in stool.  Neurological: Negative for dizziness, light-headedness and headaches.       Objective:   Physical Exam  Constitutional: He is oriented to person, place, and time. He appears well-developed and well-nourished.  HENT:  Head: Normocephalic and atraumatic.  Eyes: Pupils are equal, round, and reactive to light. EOM are normal.  Neck: No JVD present. Carotid bruit is not present.  Cardiovascular: Normal rate, regular rhythm and normal heart sounds.   No murmur heard. Pulmonary/Chest: Effort normal and breath sounds normal. He has no rales.  Musculoskeletal: He exhibits no edema.  Neurological: He is alert and oriented to person, place, and time.  Skin: Skin is warm and dry.  Psychiatric: He has a normal mood and affect.  Vitals reviewed.   Vitals:   09/03/17 0813  BP: 126/62  Pulse: 74  Resp: 16  Temp: 98.8 F (37.1 C)  SpO2: 98%  Weight: 193 lb 6.4 oz (87.7 kg)  Height: 5' 8.5" (1.74 m)      Assessment & Plan:   Alan Shah is a 66 y.o. male Essential hypertension - Plan: Comprehensive metabolic panel, hydrochlorothiazide (HYDRODIURIL) 25 MG tablet, verapamil (CALAN-SR) 180 MG CR tablet  - stable, no med changes. Labs pending.   CKD (chronic kidney disease) stage 3, GFR 30-59 ml/min (HCC) - Plan: Comprehensive metabolic panel  - continue routine follow up with nephrology. Continue BP control, goal <130/80, avoid NSAIDS.   Hyperlipidemia, unspecified hyperlipidemia type - Plan: Lipid panel Hypercholesteremia - Plan: pravastatin (PRAVACHOL) 40 MG tablet  - tolerating pravastatin, controlled prior, labs pending. No dose changes for now.   Meds ordered this encounter  Medications  .  hydrochlorothiazide (HYDRODIURIL) 25 MG tablet    Sig: Take 1 tablet (25 mg total) by mouth daily.    Dispense:  90 tablet    Refill:  1  . pravastatin (PRAVACHOL) 40 MG tablet    Sig: Take 1 tablet (40 mg total) by mouth every morning.    Dispense:  90 tablet    Refill:  1  . verapamil (CALAN-SR) 180 MG CR tablet    Sig: Take 1 tablet (180 mg total) by mouth at bedtime.    Dispense:  90 tablet    Refill:  1   Patient Instructions   Blood pressure looks good. I will check some blood work, but no med changes at this time. Keep follow up with nephrologist, but follow up with me in 6 months. Thanks for coming in today.  IF you received an x-ray today, you will receive an invoice from [facility] Rad[facility]. Please contact [facility] Radiology at [phone] with questions or concerns regarding your invoice.   IF you received labwork today, you will receive an invoice from [facility]. Please contact [facility] at [phone] with questions or concerns regarding your invoice.   Our billing staff will not be able to assist you with questions regarding bills from these companies.  You will be contacted with the lab results as soon as they are available. The fastest way to get your results is to activate your My Chart account. Instructions are located on the last page of this paperwork. If you have not heard from [place] regarding the results in 2 weeks, please contact this office.       I personally performed the services described in this documentation, which was scribed in my presence. The recorded information has been reviewed and considered for accuracy and completeness, addended by me as needed, and agree with information above.  Signed,   [name], MD [facility] [place].  09/03/17 8:47 AM

## 2017-09-04 LAB — COMPREHENSIVE METABOLIC PANEL
ALT: 19 IU/L (ref 0–44)
AST: 24 IU/L (ref 0–40)
Albumin/Globulin Ratio: 1.7 (ref 1.2–2.2)
Albumin: 4.8 g/dL (ref 3.6–4.8)
Alkaline Phosphatase: 61 IU/L (ref 39–117)
BUN/Creatinine Ratio: 17 (ref 10–24)
BUN: 39 mg/dL — ABNORMAL HIGH (ref 8–27)
Bilirubin Total: 0.4 mg/dL (ref 0.0–1.2)
CO2: 23 mmol/L (ref 20–29)
Calcium: 9.9 mg/dL (ref 8.6–10.2)
Chloride: 102 mmol/L (ref 96–106)
Creatinine, Ser: 2.26 mg/dL — ABNORMAL HIGH (ref 0.76–1.27)
GFR calc Af Amer: 34 mL/min/{1.73_m2} — ABNORMAL LOW (ref >59)
GFR calc non Af Amer: 29 mL/min/{1.73_m2} — ABNORMAL LOW (ref >59)
Globulin, Total: 2.9 g/dL (ref 1.5–4.5)
Glucose: 96 mg/dL (ref 65–99)
Potassium: 4.2 mmol/L (ref 3.5–5.2)
Sodium: 142 mmol/L (ref 134–144)
Total Protein: 7.7 g/dL (ref 6.0–8.5)

## 2017-09-04 LAB — LIPID PANEL
Chol/HDL Ratio: 2.4 ratio (ref 0.0–5.0)
Cholesterol, Total: 153 mg/dL (ref 100–199)
HDL: 64 mg/dL (ref >39)
LDL Calculated: 81 mg/dL (ref 0–99)
Triglycerides: 40 mg/dL (ref 0–149)
VLDL Cholesterol Cal: 8 mg/dL (ref 5–40)

## 2017-09-10 DIAGNOSIS — H26493 Other secondary cataract, bilateral: Secondary | ICD-10-CM | POA: Diagnosis not present

## 2017-09-10 DIAGNOSIS — H04123 Dry eye syndrome of bilateral lacrimal glands: Secondary | ICD-10-CM | POA: Diagnosis not present

## 2017-09-10 DIAGNOSIS — H524 Presbyopia: Secondary | ICD-10-CM | POA: Diagnosis not present

## 2017-09-10 DIAGNOSIS — H40053 Ocular hypertension, bilateral: Secondary | ICD-10-CM | POA: Diagnosis not present

## 2017-09-16 ENCOUNTER — Other Ambulatory Visit: Payer: Self-pay | Admitting: Family Medicine

## 2017-09-16 DIAGNOSIS — N189 Chronic kidney disease, unspecified: Secondary | ICD-10-CM

## 2017-09-16 NOTE — Progress Notes (Signed)
See lab notes. Creatinine increased from baseline.

## 2017-09-24 ENCOUNTER — Ambulatory Visit: Payer: Medicare Other | Admitting: Urgent Care

## 2017-09-24 DIAGNOSIS — N189 Chronic kidney disease, unspecified: Secondary | ICD-10-CM | POA: Diagnosis not present

## 2017-09-25 LAB — BASIC METABOLIC PANEL
BUN/Creatinine Ratio: 14 (ref 10–24)
BUN: 30 mg/dL — ABNORMAL HIGH (ref 8–27)
CO2: 25 mmol/L (ref 20–29)
Calcium: 9.5 mg/dL (ref 8.6–10.2)
Chloride: 100 mmol/L (ref 96–106)
Creatinine, Ser: 2.22 mg/dL — ABNORMAL HIGH (ref 0.76–1.27)
GFR calc Af Amer: 34 mL/min/{1.73_m2} — ABNORMAL LOW (ref >59)
GFR calc non Af Amer: 30 mL/min/{1.73_m2} — ABNORMAL LOW (ref >59)
Glucose: 112 mg/dL — ABNORMAL HIGH (ref 65–99)
Potassium: 3.9 mmol/L (ref 3.5–5.2)
Sodium: 141 mmol/L (ref 134–144)

## 2017-10-05 ENCOUNTER — Other Ambulatory Visit: Payer: Self-pay | Admitting: Family Medicine

## 2017-10-05 ENCOUNTER — Other Ambulatory Visit: Payer: Self-pay | Admitting: Physician Assistant

## 2017-10-05 ENCOUNTER — Encounter: Payer: Self-pay | Admitting: *Deleted

## 2017-10-05 DIAGNOSIS — I10 Essential (primary) hypertension: Secondary | ICD-10-CM

## 2017-10-05 DIAGNOSIS — E7800 Pure hypercholesterolemia, unspecified: Secondary | ICD-10-CM

## 2017-10-05 NOTE — Progress Notes (Signed)
Letter sent.

## 2017-11-16 ENCOUNTER — Telehealth: Payer: Self-pay | Admitting: Family Medicine

## 2017-11-16 ENCOUNTER — Other Ambulatory Visit: Payer: Self-pay | Admitting: Physician Assistant

## 2017-11-16 DIAGNOSIS — I10 Essential (primary) hypertension: Secondary | ICD-10-CM

## 2017-11-16 DIAGNOSIS — E7800 Pure hypercholesterolemia, unspecified: Secondary | ICD-10-CM

## 2017-12-03 NOTE — Telephone Encounter (Signed)
Pt is almost out of medication and it has him concerned

## 2017-12-03 NOTE — Telephone Encounter (Signed)
Tried to call pharmacy due to pt having refills so shouldn't have problems getting refills but said it wasn't accepting calls at this time. I will call later.

## 2017-12-03 NOTE — Telephone Encounter (Signed)
Pt would like to know why meds where denied. bc mail order hasnt received anything via fax or phone. Pt spoke with pharmacy this morning. Order was placed on 11/16/17

## 2017-12-07 NOTE — Telephone Encounter (Signed)
Called [facility] spoke to [place] refilled Hctz 25 mg, Pravastatin 40 mg and Verapamil 180 mg 90 days with no refills. Patient will need office visit for additional refills and advised.

## 2017-12-09 DIAGNOSIS — C61 Malignant neoplasm of prostate: Secondary | ICD-10-CM | POA: Diagnosis not present

## 2017-12-15 DIAGNOSIS — Z8546 Personal history of malignant neoplasm of prostate: Secondary | ICD-10-CM | POA: Diagnosis not present

## 2017-12-15 DIAGNOSIS — N400 Enlarged prostate without lower urinary tract symptoms: Secondary | ICD-10-CM | POA: Diagnosis not present

## 2018-01-25 ENCOUNTER — Other Ambulatory Visit: Payer: Self-pay | Admitting: Family Medicine

## 2018-01-25 DIAGNOSIS — I10 Essential (primary) hypertension: Secondary | ICD-10-CM

## 2018-01-25 DIAGNOSIS — E7800 Pure hypercholesterolemia, unspecified: Secondary | ICD-10-CM

## 2018-03-04 ENCOUNTER — Encounter: Payer: Self-pay | Admitting: Family Medicine

## 2018-03-04 ENCOUNTER — Ambulatory Visit (INDEPENDENT_AMBULATORY_CARE_PROVIDER_SITE_OTHER): Payer: Medicare Other | Admitting: Family Medicine

## 2018-03-04 ENCOUNTER — Other Ambulatory Visit: Payer: Self-pay

## 2018-03-04 VITALS — BP 122/62 | HR 80 | Temp 98.2°F | Ht 68.0 in | Wt 191.2 lb

## 2018-03-04 DIAGNOSIS — E7800 Pure hypercholesterolemia, unspecified: Secondary | ICD-10-CM

## 2018-03-04 DIAGNOSIS — I10 Essential (primary) hypertension: Secondary | ICD-10-CM | POA: Diagnosis not present

## 2018-03-04 DIAGNOSIS — D638 Anemia in other chronic diseases classified elsewhere: Secondary | ICD-10-CM

## 2018-03-04 DIAGNOSIS — N189 Chronic kidney disease, unspecified: Secondary | ICD-10-CM | POA: Diagnosis not present

## 2018-03-04 MED ORDER — PRAVASTATIN SODIUM 40 MG PO TABS: 40.0000 mg | ORAL_TABLET | Freq: Every morning | ORAL | 1 refills | Status: DC

## 2018-03-04 MED ORDER — HYDROCHLOROTHIAZIDE 25 MG PO TABS: 25.0000 mg | ORAL_TABLET | Freq: Every day | ORAL | 1 refills | Status: DC

## 2018-03-04 MED ORDER — VERAPAMIL HCL ER 180 MG PO TBCR: 180.0000 mg | EXTENDED_RELEASE_TABLET | Freq: Every day | ORAL | 1 refills | Status: DC

## 2018-03-04 NOTE — Patient Instructions (Addendum)
Please call and schedule appointment with your nephrologist  - Dr. Moshe Cipro as she had planned to see you in 6 months at visit last year. I will check bloodwork today.   Thanks for coming in today.    IF you received an x-ray today, you will receive an invoice from [facility]. Please contact [facility] Radiology at [phone] with questions or concerns regarding your invoice.   IF you received labwork today, you will receive an invoice from [place]. Please contact [facility] at [phone] with questions or concerns regarding your invoice.   Our billing staff will not be able to assist you with questions regarding bills from these companies.  You will be contacted with the lab results as soon as they are available. The fastest way to get your results is to activate your My Chart account. Instructions are located on the last page of this paperwork. If you have not heard from [place] regarding the results in 2 weeks, please contact this office.

## 2018-03-04 NOTE — Progress Notes (Signed)
Subjective:    Patient ID: Alan Shah, male    DOB: 01/16/1951, 67 y.o.   MRN: [id number]  HPI Alan Shah is a 67 y.o. male Presents today for: Chief Complaint  Patient presents with  . Chonic Conditions    6 month F/u    Here for follow-up.   Hypertension with history of chronic kidney disease: Nephrologist Dr. Moshe Cipro at [facility].  Takes HCTZ 25 mg daily, verapamil 180 mg daily. No recent appt. Urinating normally. No NSAIDS.  Lab Results  Component Value Date   CREATININE 2.22 (H) 09/24/2017   BP Readings from Last 3 Encounters:  03/04/18 122/62  09/03/17 126/62  02/26/17 140/82    Hyperlipidemia: He takes pravastatin 40 mg daily. No new myalgias.  Lab Results  Component Value Date   CHOL 153 09/03/2017   HDL 64 09/03/2017   LDLCALC 81 09/03/2017   LDLDIRECT 70 07/06/2013   TRIG 40 09/03/2017   CHOLHDL 2.4 09/03/2017   Lab Results  Component Value Date   ALT 19 09/03/2017   AST 24 09/03/2017   ALKPHOS 61 09/03/2017   BILITOT 0.4 09/03/2017   History of prostate cancer, 2014.  Radiation therapy, brachytherapy in 2014.  Office visit with Dr. Karsten Ro in January, with PSA of 0.088 at that time.  Plan for one-year follow-up.    Patient Active Problem List   Diagnosis Date Noted  . Anemia in chronic kidney disease 02/13/2016  . Stage T1c Prostate Cancer, Gleasons 3+4, PSA 8.2  s/p Prostate Seed Implant 09/08/13 10/21/2013  . Hypercholesteremia 01/10/2013  . Unspecified vitamin D deficiency 01/10/2013  . Hypertrophy of prostate without urinary obstruction and other lower urinary tract symptoms (LUTS) 01/10/2013  . HTN (hypertension) 06/01/2012  . Overweight (BMI 25.0-29.9) 06/01/2012   Past Medical History:  Diagnosis Date  . Hyperlipidemia   . Hypertension   . Prostate cancer [facility])    adenocarcinoma gleason 7   Past Surgical History:  Procedure Laterality Date  . COLONOSCOPY    . ORIF DISPLACED RIGHT TWO-PART PROXIMAL HUMEROUS  FX  03-05-2010  . PROSTATE BIOPSY  02/24/2013  &   06-07-2013  . PROSTATE SURGERY    . RADIOACTIVE SEED IMPLANT N/A 09/08/2013   Procedure: RADIOACTIVE SEED IMPLANT;  Surgeon: Claybon Jabs, MD;  Location: [facility];  Service: Urology;  Laterality: N/A;  . RIGHT SHOULDER SURGER FOR FX  2010   No Known Allergies Prior to Admission medications   Medication Sig Start Date End Date Taking? Authorizing Provider  aspirin 81 MG tablet Take 81 mg by mouth daily.   Yes [provider]  CIALIS 20 MG tablet  12/12/15  Yes [provider]  hydrochlorothiazide (HYDRODIURIL) 25 MG tablet TAKE 1 TABLET BY MOUTH  DAILY 01/26/18  Yes Wendie Agreste, MD  Multiple Vitamin (MULTIVITAMIN) tablet Take 1 tablet by mouth daily.   Yes [provider]  pravastatin (PRAVACHOL) 40 MG tablet TAKE 1 TABLET BY MOUTH  EVERY MORNING 01/26/18  Yes Wendie Agreste, MD  verapamil (CALAN-SR) 180 MG CR tablet TAKE 1 TABLET BY MOUTH AT  BEDTIME 01/26/18  Yes Wendie Agreste, MD   Social History   Socioeconomic History  . Marital status: Single    Spouse name: Not on file  . Number of children: Not on file  . Years of education: Not on file  . Highest education level: Not on file  Occupational History  . Occupation: RETIRED  Social Needs  .  Financial resource strain: Not on file  . Food insecurity:    Worry: Not on file    Inability: Not on file  . Transportation needs:    Medical: Not on file    Non-medical: Not on file  Tobacco Use  . Smoking status: Never Smoker  . Smokeless tobacco: Never Used  Substance and Sexual Activity  . Alcohol use: Yes    Alcohol/week: 0.6 oz    Types: 1 Standard drinks or equivalent per week    Comment: RARE  . Drug use: No  . Sexual activity: Yes  Lifestyle  . Physical activity:    Days per week: Not on file    Minutes per session: Not on file  . Stress: Not on file  Relationships  . Social connections:    Talks on phone: Not on  file    Gets together: Not on file    Attends religious service: Not on file    Active member of club or organization: Not on file    Attends meetings of clubs or organizations: Not on file    Relationship status: Not on file  . Intimate partner violence:    Fear of current or ex partner: Not on file    Emotionally abused: Not on file    Physically abused: Not on file    Forced sexual activity: Not on file  Other Topics Concern  . Not on file  Social History Narrative  . Not on file    Review of Systems  Constitutional: Negative for fatigue and unexpected weight change.  Eyes: Negative for visual disturbance.  Respiratory: Negative for cough, chest tightness and shortness of breath.   Cardiovascular: Negative for chest pain, palpitations and leg swelling.  Gastrointestinal: Negative for abdominal pain and blood in stool.  Neurological: Negative for dizziness, light-headedness and headaches.       Objective:   Physical Exam  Constitutional: He is oriented to person, place, and time. He appears well-developed and well-nourished.  HENT:  Head: Normocephalic and atraumatic.  Eyes: Pupils are equal, round, and reactive to light. EOM are normal.  Neck: No JVD present. Carotid bruit is not present.  Cardiovascular: Normal rate, regular rhythm and normal heart sounds.  No murmur heard. Pulmonary/Chest: Effort normal and breath sounds normal. He has no rales.  Musculoskeletal: He exhibits no edema.  Neurological: He is alert and oriented to person, place, and time.  Skin: Skin is warm and dry.  Psychiatric: He has a normal mood and affect.  Vitals reviewed.  Vitals:   03/04/18 0819  BP: 122/62  Pulse: 80  Temp: 98.2 F (36.8 C)  TempSrc: Oral  SpO2: 98%  Weight: 191 lb 3.2 oz (86.7 kg)  Height: 5\' 8"  (1.727 m)      Assessment & Plan:   FRANZ SVEC is a 67 y.o. male Chronic kidney disease, unspecified CKD stage  -Check creatinine on CMP.  Continue to avoid NSAIDs.   Recommended he call to schedule follow-up with nephrologist as it appears last visit was in February 2018 with recommended 90-month follow-up  Essential hypertension - Plan: Comprehensive metabolic panel, verapamil (CALAN-SR) 180 MG CR tablet, hydrochlorothiazide (HYDRODIURIL) 25 MG tablet  -Stable, tolerating current regimen, no changes.  Labs pending  Hypercholesteremia - Plan: Comprehensive metabolic panel, Lipid panel, pravastatin (PRAVACHOL) 40 MG tablet  -Tolerating statin without myopathy or other new side effects.  Labs pending, continue same dose Pravachol  Anemia of chronic disease - Plan: CBC  -Asymptomatic, check CBC  for stability, follow-up with nephrology as recommended above.  Recheck 6 months for physical  Meds ordered this encounter  Medications  . verapamil (CALAN-SR) 180 MG CR tablet    Sig: Take 1 tablet (180 mg total) by mouth at bedtime.    Dispense:  90 tablet    Refill:  1  . pravastatin (PRAVACHOL) 40 MG tablet    Sig: Take 1 tablet (40 mg total) by mouth every morning.    Dispense:  90 tablet    Refill:  1  . hydrochlorothiazide (HYDRODIURIL) 25 MG tablet    Sig: Take 1 tablet (25 mg total) by mouth daily.    Dispense:  90 tablet    Refill:  1   Patient Instructions   Please call and schedule appointment with your nephrologist  - Dr. Moshe Cipro as she had planned to see you in 6 months at visit last year. I will check bloodwork today.       IF you received an x-ray today, you will receive an invoice from [facility] Radiology. Please contact [facility] Radiology at [phone] with questions or concerns regarding your invoice.   IF you received labwork today, you will receive an invoice from [place] [facility]. Please contact [facility] at [phone] with questions or concerns regarding your invoice.   Our billing staff will not be able to assist you with questions regarding bills from these companies.  You will be contacted with the lab results as soon as  they are available. The fastest way to get your results is to activate your My Chart account. Instructions are located on the last page of this paperwork. If you have not heard from [place] regarding the results in 2 weeks, please contact this office.       Signed,   [name], MD [facility] [place].  03/04/18 8:59 AM

## 2018-03-05 LAB — COMPREHENSIVE METABOLIC PANEL
ALT: 20 IU/L (ref 0–44)
AST: 23 IU/L (ref 0–40)
Albumin/Globulin Ratio: 1.6 (ref 1.2–2.2)
Albumin: 4.6 g/dL (ref 3.6–4.8)
Alkaline Phosphatase: 59 IU/L (ref 39–117)
BUN/Creatinine Ratio: 20 (ref 10–24)
BUN: 44 mg/dL — ABNORMAL HIGH (ref 8–27)
Bilirubin Total: 0.3 mg/dL (ref 0.0–1.2)
CO2: 25 mmol/L (ref 20–29)
Calcium: 9.9 mg/dL (ref 8.6–10.2)
Chloride: 101 mmol/L (ref 96–106)
Creatinine, Ser: 2.16 mg/dL — ABNORMAL HIGH (ref 0.76–1.27)
GFR calc Af Amer: 36 mL/min/{1.73_m2} — ABNORMAL LOW (ref >59)
GFR calc non Af Amer: 31 mL/min/{1.73_m2} — ABNORMAL LOW (ref >59)
Globulin, Total: 2.9 g/dL (ref 1.5–4.5)
Glucose: 96 mg/dL (ref 65–99)
Potassium: 3.8 mmol/L (ref 3.5–5.2)
Sodium: 141 mmol/L (ref 134–144)
Total Protein: 7.5 g/dL (ref 6.0–8.5)

## 2018-03-05 LAB — CBC
Hematocrit: 36.3 % — ABNORMAL LOW (ref 37.5–51.0)
Hemoglobin: 12 g/dL — ABNORMAL LOW (ref 13.0–17.7)
MCH: 27.6 pg (ref 26.6–33.0)
MCHC: 33.1 g/dL (ref 31.5–35.7)
MCV: 84 fL (ref 79–97)
Platelets: 193 10*3/uL (ref 150–379)
RBC: 4.34 x10E6/uL (ref 4.14–5.80)
RDW: 13.4 % (ref 12.3–15.4)
WBC: 4.4 10*3/uL (ref 3.4–10.8)

## 2018-03-05 LAB — LIPID PANEL
Chol/HDL Ratio: 2.4 ratio (ref 0.0–5.0)
Cholesterol, Total: 154 mg/dL (ref 100–199)
HDL: 64 mg/dL (ref >39)
LDL Calculated: 82 mg/dL (ref 0–99)
Triglycerides: 38 mg/dL (ref 0–149)
VLDL Cholesterol Cal: 8 mg/dL (ref 5–40)

## 2018-03-15 ENCOUNTER — Encounter: Payer: Self-pay | Admitting: *Deleted

## 2018-03-29 DIAGNOSIS — D649 Anemia, unspecified: Secondary | ICD-10-CM | POA: Diagnosis not present

## 2018-03-29 DIAGNOSIS — I129 Hypertensive chronic kidney disease with stage 1 through stage 4 chronic kidney disease, or unspecified chronic kidney disease: Secondary | ICD-10-CM | POA: Diagnosis not present

## 2018-03-29 DIAGNOSIS — N183 Chronic kidney disease, stage 3 (moderate): Secondary | ICD-10-CM | POA: Diagnosis not present

## 2018-03-29 DIAGNOSIS — Z6830 Body mass index (BMI) 30.0-30.9, adult: Secondary | ICD-10-CM | POA: Diagnosis not present

## 2018-03-29 DIAGNOSIS — E7800 Pure hypercholesterolemia, unspecified: Secondary | ICD-10-CM | POA: Diagnosis not present

## 2018-05-27 DIAGNOSIS — L309 Dermatitis, unspecified: Secondary | ICD-10-CM | POA: Diagnosis not present

## 2018-06-16 DIAGNOSIS — I10 Essential (primary) hypertension: Secondary | ICD-10-CM | POA: Diagnosis not present

## 2018-06-16 DIAGNOSIS — Z86010 Personal history of colonic polyps: Secondary | ICD-10-CM | POA: Diagnosis not present

## 2018-06-16 DIAGNOSIS — E782 Mixed hyperlipidemia: Secondary | ICD-10-CM | POA: Diagnosis not present

## 2018-07-01 DIAGNOSIS — K5730 Diverticulosis of large intestine without perforation or abscess without bleeding: Secondary | ICD-10-CM | POA: Diagnosis not present

## 2018-07-01 DIAGNOSIS — Z86010 Personal history of colonic polyps: Secondary | ICD-10-CM | POA: Diagnosis not present

## 2018-07-04 ENCOUNTER — Other Ambulatory Visit: Payer: Self-pay | Admitting: Family Medicine

## 2018-07-04 DIAGNOSIS — I10 Essential (primary) hypertension: Secondary | ICD-10-CM

## 2018-07-04 DIAGNOSIS — E7800 Pure hypercholesterolemia, unspecified: Secondary | ICD-10-CM

## 2018-07-05 NOTE — Telephone Encounter (Signed)
Refill req on:   Verapamil; # 90; RF x 1 Hydrochlorothiazide; #90; RF x 1 Pravastatin; # 90; RF x 1  Last OV: 03/04/18  PCP: Dr. Leanne Lovely.: [facility]  Above meds refilled per protocol

## 2018-09-06 ENCOUNTER — Encounter: Payer: Self-pay | Admitting: Family Medicine

## 2018-09-06 ENCOUNTER — Other Ambulatory Visit: Payer: Self-pay

## 2018-09-06 ENCOUNTER — Ambulatory Visit (INDEPENDENT_AMBULATORY_CARE_PROVIDER_SITE_OTHER): Payer: Medicare Other | Admitting: Family Medicine

## 2018-09-06 VITALS — BP 122/72 | HR 73 | Temp 98.0°F | Resp 16 | Ht 68.31 in | Wt 187.4 lb

## 2018-09-06 DIAGNOSIS — D638 Anemia in other chronic diseases classified elsewhere: Secondary | ICD-10-CM | POA: Diagnosis not present

## 2018-09-06 DIAGNOSIS — E7800 Pure hypercholesterolemia, unspecified: Secondary | ICD-10-CM

## 2018-09-06 DIAGNOSIS — Z0000 Encounter for general adult medical examination without abnormal findings: Secondary | ICD-10-CM

## 2018-09-06 DIAGNOSIS — I10 Essential (primary) hypertension: Secondary | ICD-10-CM

## 2018-09-06 DIAGNOSIS — Z23 Encounter for immunization: Secondary | ICD-10-CM

## 2018-09-06 MED ORDER — PRAVASTATIN SODIUM 40 MG PO TABS: 40.0000 mg | ORAL_TABLET | Freq: Every morning | ORAL | 2 refills | Status: DC

## 2018-09-06 MED ORDER — ZOSTER VAC RECOMB ADJUVANTED 50 MCG/0.5ML IM SUSR: 0.5000 mL | Freq: Once | INTRAMUSCULAR | 1 refills | Status: AC

## 2018-09-06 MED ORDER — VERAPAMIL HCL ER 180 MG PO TBCR: 180.0000 mg | EXTENDED_RELEASE_TABLET | Freq: Every day | ORAL | 2 refills | Status: DC

## 2018-09-06 MED ORDER — HYDROCHLOROTHIAZIDE 25 MG PO TABS: 25.0000 mg | ORAL_TABLET | Freq: Every day | ORAL | 2 refills | Status: DC

## 2018-09-06 NOTE — Progress Notes (Signed)
Subjective:  By signing my name below, I, Essence Howell, attest that this documentation has been prepared under the direction and in the presence of Wendie Agreste, MD Electronically Signed: Ladene Artist, ED Scribe 09/06/2018 at 8:21 AM.   Patient ID: Alan Shah, male    DOB: 1951-02-09, 67 y.o.   MRN: [id number]  Chief Complaint  Patient presents with  . Medicare Wellness   HPI  Alan Shah is a 67 y.o. male who presents to Primary Care at [facility] for a medicare wellness exam. H/o HTN, CKD, prostate CA, hyperlipidemia. Pt is fasting at this time.  Prostate CA Urologist: [facility] Dr. Karsten Ro. Radiation therapy 09/08/13. Last OV in Jan, plan for f/u in 1 yr. 0.088 in Jan.  CKD Followed by [facility] Dr. Moshe Cipro. Anemia in CKD, not at treatment level when seen in April. No changes at OV. Plan for 6 month f/u. - Appointment is scheduled for 10/28.  HTN Lab Results  Component Value Date   CREATININE 2.16 (H) 03/04/2018   BP Readings from Last 3 Encounters:  09/06/18 122/72  03/04/18 122/62  09/03/17 126/62  HCTZ 25 mg qd, verapamil 100 mg qd. - Pt denies new side-effects with meds.  Hyperlipidemia Lab Results  Component Value Date   CHOL 154 03/04/2018   HDL 64 03/04/2018   LDLCALC 82 03/04/2018   LDLDIRECT 70 07/06/2013   TRIG 38 03/04/2018   CHOLHDL 2.4 03/04/2018   Lab Results  Component Value Date   ALT 20 03/04/2018   AST 23 03/04/2018   ALKPHOS 59 03/04/2018   BILITOT 0.3 03/04/2018  Pravastatin 40 mg qd. - Pt states pravastatin in the morning. Denies new myalgias, side-effects.  CA Screening Colonoscopy: 07/01/18, rpt 5 yrs Prostate CA Screening: followed by [facility], h/o prostate CA as above No results found for: PSA  Immunizations Immunization History  Administered Date(s) Administered  . Pneumococcal Conjugate-13 01/05/2015  . Pneumococcal Polysaccharide-23 02/26/2017  . Tdap 04/25/2010  . Zoster 09/01/2011    Shingrix: sent to pharmacy Flu: today  Fall Screening No falls within the past yr.   Depression Screening Depression screen [facility] 2/9 09/06/2018 03/04/2018 09/03/2017 02/26/2017 07/10/2016  Decreased Interest 0 0 0 0 0  Down, Depressed, Hopeless 0 0 0 0 0  PHQ - 2 Score 0 0 0 0 0   Functional Status Survey: Is the patient deaf or have difficulty hearing?: No Does the patient have difficulty seeing, even when wearing glasses/contacts?: No Does the patient have difficulty concentrating, remembering, or making decisions?: No Does the patient have difficulty walking or climbing stairs?: No Does the patient have difficulty dressing or bathing?: No Does the patient have difficulty doing errands alone such as visiting a doctor's office or shopping?: No  Mental Status Screening No flowsheet data found.    Visual Acuity Screening   Right eye Left eye Both eyes  Without correction: 20/25 20/25 20/25   With correction:      Vision:  Dentist: regularly Exercise: 2 hours (1 hour on treadmill, 1 hour on bicycle) 6 days/wk  Advanced Directives  Does not have a medical advanced directive. Pt states that he still has the information he was given in April.  Patient Active Problem List   Diagnosis Date Noted  . Anemia in chronic kidney disease 02/13/2016  . Stage T1c Prostate Cancer, Gleasons 3+4, PSA 8.2  s/p Prostate Seed Implant 09/08/13 10/21/2013  . Hypercholesteremia 01/10/2013  . Unspecified vitamin D deficiency 01/10/2013  .  Hypertrophy of prostate without urinary obstruction and other lower urinary tract symptoms (LUTS) 01/10/2013  . HTN (hypertension) 06/01/2012  . Overweight (BMI 25.0-29.9) 06/01/2012   Past Medical History:  Diagnosis Date  . Hyperlipidemia   . Hypertension   . Prostate cancer [facility])    adenocarcinoma gleason 7   Past Surgical History:  Procedure Laterality Date  . COLONOSCOPY    . ORIF DISPLACED RIGHT TWO-PART PROXIMAL HUMEROUS FX  03-05-2010  . PROSTATE  BIOPSY  02/24/2013  &   06-07-2013  . PROSTATE SURGERY    . RADIOACTIVE SEED IMPLANT N/A 09/08/2013   Procedure: RADIOACTIVE SEED IMPLANT;  Surgeon: Claybon Jabs, MD;  Location[facility];  Service: Urology;  Laterality: N/A;  . RIGHT SHOULDER SURGER FOR FX  2010   No Known Allergies Prior to Admission medications   Medication Sig Start Date End Date Taking? Authorizing Provider  aspirin 81 MG tablet Take 81 mg by mouth daily.   Yes [provider]  CIALIS 20 MG tablet  12/12/15  Yes [provider]  hydrochlorothiazide (HYDRODIURIL) 25 MG tablet TAKE 1 TABLET BY MOUTH  DAILY 07/05/18  Yes Wendie Agreste, MD  Multiple Vitamin (MULTIVITAMIN) tablet Take 1 tablet by mouth daily.   Yes [provider]  pravastatin (PRAVACHOL) 40 MG tablet TAKE 1 TABLET BY MOUTH  EVERY MORNING 07/05/18  Yes Wendie Agreste, MD  verapamil (CALAN-SR) 180 MG CR tablet TAKE 1 TABLET BY MOUTH AT  BEDTIME 07/05/18  Yes Wendie Agreste, MD   Social History   Socioeconomic History  . Marital status: Single    Spouse name: Not on file  . Number of children: Not on file  . Years of education: Not on file  . Highest education level: Not on file  Occupational History  . Occupation: RETIRED  Social Needs  . Financial resource strain: Not on file  . Food insecurity:    Worry: Not on file    Inability: Not on file  . Transportation needs:    Medical: Not on file    Non-medical: Not on file  Tobacco Use  . Smoking status: Never Smoker  . Smokeless tobacco: Never Used  Substance and Sexual Activity  . Alcohol use: Yes    Alcohol/week: 1.0 standard drinks    Types: 1 Standard drinks or equivalent per week    Comment: RARE  . Drug use: No  . Sexual activity: Yes  Lifestyle  . Physical activity:    Days per week: Not on file    Minutes per session: Not on file  . Stress: Not on file  Relationships  . Social connections:    Talks on phone: Not on file    Gets  together: Not on file    Attends religious service: Not on file    Active member of club or organization: Not on file    Attends meetings of clubs or organizations: Not on file    Relationship status: Not on file  . Intimate partner violence:    Fear of current or ex partner: Not on file    Emotionally abused: Not on file    Physically abused: Not on file    Forced sexual activity: Not on file  Other Topics Concern  . Not on file  Social History Narrative  . Not on file   Review of Systems  All other systems reviewed and are negative. 13 point ROS, neg    Objective:   Physical  Exam  Constitutional: He is oriented to person, place, and time. He appears well-developed and well-nourished.  HENT:  Head: Normocephalic and atraumatic.  Right Ear: External ear normal.  Left Ear: External ear normal.  Mouth/Throat: Oropharynx is clear and moist.  Eyes: Pupils are equal, round, and reactive to light. Conjunctivae and EOM are normal.  Neck: Normal range of motion. Neck supple. No thyromegaly present.  Cardiovascular: Normal rate, regular rhythm, normal heart sounds and intact distal pulses.  Pulmonary/Chest: Effort normal and breath sounds normal. No respiratory distress. He has no wheezes.  Abdominal: Soft. He exhibits no distension. There is no tenderness.  Musculoskeletal: Normal range of motion. He exhibits no edema or tenderness.  Lymphadenopathy:    He has no cervical adenopathy.  Neurological: He is alert and oriented to person, place, and time. He has normal reflexes.  Skin: Skin is warm and dry.  Psychiatric: He has a normal mood and affect. His behavior is normal.  Vitals reviewed.    Vitals:   09/06/18 0759  BP: 122/72  Pulse: 73  Resp: 16  Temp: 98 F (36.7 C)  TempSrc: Oral  SpO2: 98%  Weight: 187 lb 6.4 oz (85 kg)  Height: 5' 8.31" (1.735 m)      Assessment & Plan:   Alan Shah is a 67 y.o. male Medicare annual wellness visit, subsequent  -  -  anticipatory guidance as below in AVS, screening labs if needed. Health maintenance items as above in HPI discussed/recommended as applicable.   - no concerning responses on depression, fall, or functional status screening. Any positive responses noted as above. Advanced directives discussed as in CHL.   Essential hypertension - Plan: hydrochlorothiazide (HYDRODIURIL) 25 MG tablet, verapamil (CALAN-SR) 180 MG CR tablet  - stable, no med changes.   Hypercholesteremia - Plan: Comprehensive metabolic panel, Lipid panel, pravastatin (PRAVACHOL) 40 MG tablet  -  Stable, tolerating current regimen. Medications refilled. Labs pending as above.   Need for shingles vaccine - Plan: Zoster Vaccine Adjuvanted [facility]) injection sent to pharmacy  Anemia, chronic disease - Plan: CBC  -check stability with CBC. Not at level for treatment prior.   Flu vaccine need - Plan: Flu vaccine HIGH DOSE PF (Fluzone High dose) given.    Meds ordered this encounter  Medications  . hydrochlorothiazide (HYDRODIURIL) 25 MG tablet    Sig: Take 1 tablet (25 mg total) by mouth daily.    Dispense:  90 tablet    Refill:  2  . verapamil (CALAN-SR) 180 MG CR tablet    Sig: Take 1 tablet (180 mg total) by mouth at bedtime.    Dispense:  90 tablet    Refill:  2  . pravastatin (PRAVACHOL) 40 MG tablet    Sig: Take 1 tablet (40 mg total) by mouth every morning.    Dispense:  90 tablet    Refill:  2  . Zoster Vaccine Adjuvanted [facility]) injection    Sig: Inject 0.5 mLs into the muscle once for 1 dose. Repeat in 2-6 months.    Dispense:  0.5 mL    Refill:  1   Patient Instructions     Thanks for coming in today.  No med changes today.   Keep follow up with specialists as planned.   Shingles vaccine sent to your pharmacy.   If you have lab work done today you will be contacted with your lab results within the next 2 weeks.  If you have not heard from [place] then  please contact us. The fastest way to get your  results is to register for My Chart.   IF you received an x-ray today, you will receive an invoice from [facility]. Please contact [facility] at [phone] with questions or concerns regarding your invoice.   IF you received labwork today, you will receive an invoice from [place]. Please contact [facility] at [phone] with questions or concerns regarding your invoice.   Our billing staff will not be able to assist you with questions regarding bills from these companies.  You will be contacted with the lab results as soon as they are available. The fastest way to get your results is to activate your My Chart account. Instructions are located on the last page of this paperwork. If you have not heard from [place] regarding the results in 2 weeks, please contact this office.       I personally performed the services described in this documentation, which was scribed in my presence. The recorded information has been reviewed and considered for accuracy and completeness, addended by me as needed, and agree with information above.  Signed,   [name], MD Primary Care at [place].  09/06/18 8:37 AM

## 2018-09-06 NOTE — Patient Instructions (Addendum)
   Thanks for coming in today.  No med changes today.   Keep follow up with specialists as planned.   Shingles vaccine sent to your pharmacy.   If you have lab work done today you will be contacted with your lab results within the next 2 weeks.  If you have not heard from [place] then please contact us. The fastest way to get your results is to register for My Chart.   IF you received an x-ray today, you will receive an invoice from [facility]. Please contact [facility] at [phone] with questions or concerns regarding your invoice.   IF you received labwork today, you will receive an invoice from [place]. Please contact [facility] at [phone] with questions or concerns regarding your invoice.   Our billing staff will not be able to assist you with questions regarding bills from these companies.  You will be contacted with the lab results as soon as they are available. The fastest way to get your results is to activate your My Chart account. Instructions are located on the last page of this paperwork. If you have not heard from [place] regarding the results in 2 weeks, please contact this office.

## 2018-09-07 LAB — COMPREHENSIVE METABOLIC PANEL
ALT: 17 IU/L (ref 0–44)
AST: 23 IU/L (ref 0–40)
Albumin/Globulin Ratio: 2 (ref 1.2–2.2)
Albumin: 5 g/dL — ABNORMAL HIGH (ref 3.6–4.8)
Alkaline Phosphatase: 52 IU/L (ref 39–117)
BUN/Creatinine Ratio: 18 (ref 10–24)
BUN: 39 mg/dL — ABNORMAL HIGH (ref 8–27)
Bilirubin Total: 0.3 mg/dL (ref 0.0–1.2)
CO2: 24 mmol/L (ref 20–29)
Calcium: 10 mg/dL (ref 8.6–10.2)
Chloride: 100 mmol/L (ref 96–106)
Creatinine, Ser: 2.15 mg/dL — ABNORMAL HIGH (ref 0.76–1.27)
GFR calc Af Amer: 36 mL/min/{1.73_m2} — ABNORMAL LOW (ref >59)
GFR calc non Af Amer: 31 mL/min/{1.73_m2} — ABNORMAL LOW (ref >59)
Globulin, Total: 2.5 g/dL (ref 1.5–4.5)
Glucose: 86 mg/dL (ref 65–99)
Potassium: 4.1 mmol/L (ref 3.5–5.2)
Sodium: 142 mmol/L (ref 134–144)
Total Protein: 7.5 g/dL (ref 6.0–8.5)

## 2018-09-07 LAB — LIPID PANEL
Chol/HDL Ratio: 2.2 ratio (ref 0.0–5.0)
Cholesterol, Total: 157 mg/dL (ref 100–199)
HDL: 70 mg/dL (ref >39)
LDL Calculated: 80 mg/dL (ref 0–99)
Triglycerides: 35 mg/dL (ref 0–149)
VLDL Cholesterol Cal: 7 mg/dL (ref 5–40)

## 2018-09-07 LAB — CBC
Hematocrit: 38 % (ref 37.5–51.0)
Hemoglobin: 12.4 g/dL — ABNORMAL LOW (ref 13.0–17.7)
MCH: 28.5 pg (ref 26.6–33.0)
MCHC: 32.6 g/dL (ref 31.5–35.7)
MCV: 87 fL (ref 79–97)
Platelets: 182 10*3/uL (ref 150–450)
RBC: 4.35 x10E6/uL (ref 4.14–5.80)
RDW: 12.8 % (ref 12.3–15.4)
WBC: 4 10*3/uL (ref 3.4–10.8)

## 2018-09-14 ENCOUNTER — Encounter: Payer: Self-pay | Admitting: *Deleted

## 2018-09-20 DIAGNOSIS — H40053 Ocular hypertension, bilateral: Secondary | ICD-10-CM | POA: Diagnosis not present

## 2018-09-20 DIAGNOSIS — H26493 Other secondary cataract, bilateral: Secondary | ICD-10-CM | POA: Diagnosis not present

## 2018-09-20 DIAGNOSIS — Z961 Presence of intraocular lens: Secondary | ICD-10-CM | POA: Diagnosis not present

## 2018-09-20 DIAGNOSIS — H04123 Dry eye syndrome of bilateral lacrimal glands: Secondary | ICD-10-CM | POA: Diagnosis not present

## 2018-09-27 DIAGNOSIS — Z6830 Body mass index (BMI) 30.0-30.9, adult: Secondary | ICD-10-CM | POA: Diagnosis not present

## 2018-09-27 DIAGNOSIS — N183 Chronic kidney disease, stage 3 (moderate): Secondary | ICD-10-CM | POA: Diagnosis not present

## 2018-09-27 DIAGNOSIS — E7800 Pure hypercholesterolemia, unspecified: Secondary | ICD-10-CM | POA: Diagnosis not present

## 2018-09-27 DIAGNOSIS — D649 Anemia, unspecified: Secondary | ICD-10-CM | POA: Diagnosis not present

## 2018-09-27 DIAGNOSIS — I129 Hypertensive chronic kidney disease with stage 1 through stage 4 chronic kidney disease, or unspecified chronic kidney disease: Secondary | ICD-10-CM | POA: Diagnosis not present

## 2018-12-13 DIAGNOSIS — C61 Malignant neoplasm of prostate: Secondary | ICD-10-CM | POA: Diagnosis not present

## 2018-12-17 DIAGNOSIS — N400 Enlarged prostate without lower urinary tract symptoms: Secondary | ICD-10-CM | POA: Diagnosis not present

## 2018-12-17 DIAGNOSIS — Z8546 Personal history of malignant neoplasm of prostate: Secondary | ICD-10-CM | POA: Diagnosis not present

## 2018-12-28 ENCOUNTER — Telehealth: Payer: Self-pay | Admitting: Family Medicine

## 2018-12-28 NOTE — Telephone Encounter (Signed)
Called and spoke with pt regarding their appt scheduled with Dr. Carlota Raspberry on 03/08/19. Due to Dr. Carlota Raspberry being out of the office, pt was needing to be rescheduled. I was able to get pt rescheduled to 03/14/19 at 9:00. I advised of time, building number and late policy. Pt acknowledged.

## 2019-03-08 ENCOUNTER — Other Ambulatory Visit: Payer: Self-pay

## 2019-03-08 ENCOUNTER — Ambulatory Visit: Payer: Medicare Other | Admitting: Family Medicine

## 2019-03-08 DIAGNOSIS — N189 Chronic kidney disease, unspecified: Secondary | ICD-10-CM

## 2019-03-08 DIAGNOSIS — E7800 Pure hypercholesterolemia, unspecified: Secondary | ICD-10-CM

## 2019-03-08 DIAGNOSIS — I10 Essential (primary) hypertension: Secondary | ICD-10-CM

## 2019-03-14 ENCOUNTER — Ambulatory Visit (INDEPENDENT_AMBULATORY_CARE_PROVIDER_SITE_OTHER): Payer: Medicare Other | Admitting: Family Medicine

## 2019-03-14 ENCOUNTER — Other Ambulatory Visit: Payer: Self-pay

## 2019-03-14 ENCOUNTER — Encounter: Payer: Self-pay | Admitting: Family Medicine

## 2019-03-14 DIAGNOSIS — E7800 Pure hypercholesterolemia, unspecified: Secondary | ICD-10-CM

## 2019-03-14 DIAGNOSIS — N189 Chronic kidney disease, unspecified: Secondary | ICD-10-CM | POA: Diagnosis not present

## 2019-03-14 DIAGNOSIS — I10 Essential (primary) hypertension: Secondary | ICD-10-CM | POA: Diagnosis not present

## 2019-03-14 MED ORDER — HYDROCHLOROTHIAZIDE 25 MG PO TABS: 25.0000 mg | ORAL_TABLET | Freq: Every day | ORAL | 2 refills | Status: DC

## 2019-03-14 MED ORDER — VERAPAMIL HCL ER 180 MG PO TBCR: 180.0000 mg | EXTENDED_RELEASE_TABLET | Freq: Every day | ORAL | 2 refills | Status: DC

## 2019-03-14 MED ORDER — PRAVASTATIN SODIUM 40 MG PO TABS: 40.0000 mg | ORAL_TABLET | Freq: Every morning | ORAL | 2 refills | Status: DC

## 2019-03-14 NOTE — Patient Instructions (Addendum)
Good talking to you today.  No change in blood pressure or cholesterol medicines based on your home readings.  Keep appointment with nephrologist as planned.  I will let you know if there are any changes needed once I review your lab results.  Follow-up in 6 months, but please let me know if there are questions in the meantime.    If you have lab work done today you will be contacted with your lab results within the next 2 weeks.  If you have not heard from [place] then please contact us. The fastest way to get your results is to register for My Chart.   IF you received an x-ray today, you will receive an invoice from [facility]. Please contact [facility] at [phone] with questions or concerns regarding your invoice.   IF you received labwork today, you will receive an invoice from [place]. Please contact [facility] at [phone] with questions or concerns regarding your invoice.   Our billing staff will not be able to assist you with questions regarding bills from these companies.  You will be contacted with the lab results as soon as they are available. The fastest way to get your results is to activate your My Chart account. Instructions are located on the last page of this paperwork. If you have not heard from [place] regarding the results in 2 weeks, please contact this office.

## 2019-03-14 NOTE — Progress Notes (Signed)
Virtual Visit via Telephone Note  I connected with Alan Shah on 03/14/19 at 9:37 AM by telephone and verified that I am speaking with the correct person using two identifiers.   I discussed the limitations, risks, security and privacy concerns of performing an evaluation and management service by telephone and the availability of in person appointments. I also discussed with the patient that there may be a patient responsible charge related to this service. The patient expressed understanding and agreed to proceed, consent obtained  Chief complaint:  HTN, chol.    History of Present Illness:  Hypertension: BP Readings from Last 3 Encounters:  03/14/19 128/76  09/06/18 122/72  03/04/18 122/62   Lab Results  Component Value Date   CREATININE 2.15 (H) 09/06/2018  Takes hydrochlorothiazide 25 mg daily, verapamil 180 mg daily. No new side effects.  Home BP 124/74 No HA, CP, lightheadedness/dizziness, or leg swelling/dyspnea.    History of borderline anemia, with chronic kidney disease.  Chronic kidney disease thought to be slowly progressive over the past 7 years due to hypertension. Nephrologist Dr. Moshe Cipro. Has appt next month.  Last appointment 09/19/2018 Lab Results  Component Value Date   WBC 4.0 09/06/2018   HGB 12.4 (L) 09/06/2018   HCT 38.0 09/06/2018   MCV 87 09/06/2018   PLT 182 09/06/2018    Hyperlipidemia:  Lab Results  Component Value Date   CHOL 157 09/06/2018   HDL 70 09/06/2018   LDLCALC 80 09/06/2018   LDLDIRECT 70 07/06/2013   TRIG 35 09/06/2018   CHOLHDL 2.2 09/06/2018   Lab Results  Component Value Date   ALT 17 09/06/2018   AST 23 09/06/2018   ALKPHOS 52 09/06/2018   BILITOT 0.3 09/06/2018  Takes pravastatin 40 mg daily.  Fasting labs obtained today. No new myalgias.   HM - up to date.     Patient Active Problem List   Diagnosis Date Noted  . Anemia in chronic kidney disease 02/13/2016  . Stage T1c Prostate Cancer, Gleasons  3+4, PSA 8.2  s/p Prostate Seed Implant 09/08/13 10/21/2013  . Hypercholesteremia 01/10/2013  . Unspecified vitamin D deficiency 01/10/2013  . Hypertrophy of prostate without urinary obstruction and other lower urinary tract symptoms (LUTS) 01/10/2013  . HTN (hypertension) 06/01/2012  . Overweight (BMI 25.0-29.9) 06/01/2012   Past Medical History:  Diagnosis Date  . Hyperlipidemia   . Hypertension   . Prostate cancer [facility])    adenocarcinoma gleason 7   Past Surgical History:  Procedure Laterality Date  . COLONOSCOPY    . ORIF DISPLACED RIGHT TWO-PART PROXIMAL HUMEROUS FX  03-05-2010  . PROSTATE BIOPSY  02/24/2013  &   06-07-2013  . PROSTATE SURGERY    . RADIOACTIVE SEED IMPLANT N/A 09/08/2013   Procedure: RADIOACTIVE SEED IMPLANT;  Surgeon: Claybon Jabs, MD;  Location[facility];  Service: Urology;  Laterality: N/A;  . RIGHT SHOULDER SURGER FOR FX  2010   No Known Allergies Prior to Admission medications   Medication Sig Start Date End Date Taking? Authorizing Provider  aspirin 81 MG tablet Take 81 mg by mouth daily.   Yes [provider]  CIALIS 20 MG tablet  12/12/15  Yes [provider]  hydrochlorothiazide (HYDRODIURIL) 25 MG tablet Take 1 tablet (25 mg total) by mouth daily. 09/06/18  Yes Wendie Agreste, MD  Multiple Vitamin (MULTIVITAMIN) tablet Take 1 tablet by mouth daily.   Yes [provider]  pravastatin (PRAVACHOL) 40 MG tablet Take 1  tablet (40 mg total) by mouth every morning. 09/06/18  Yes Wendie Agreste, MD  verapamil (CALAN-SR) 180 MG CR tablet Take 1 tablet (180 mg total) by mouth at bedtime. 09/06/18  Yes Wendie Agreste, MD   Social History   Socioeconomic History  . Marital status: Single    Spouse name: Not on file  . Number of children: Not on file  . Years of education: Not on file  . Highest education level: Not on file  Occupational History  . Occupation: RETIRED  Social Needs  . Financial resource  strain: Not on file  . Food insecurity:    Worry: Not on file    Inability: Not on file  . Transportation needs:    Medical: Not on file    Non-medical: Not on file  Tobacco Use  . Smoking status: Never Smoker  . Smokeless tobacco: Never Used  Substance and Sexual Activity  . Alcohol use: Yes    Alcohol/week: 1.0 standard drinks    Types: 1 Standard drinks or equivalent per week    Comment: RARE  . Drug use: No  . Sexual activity: Yes  Lifestyle  . Physical activity:    Days per week: Not on file    Minutes per session: Not on file  . Stress: Not on file  Relationships  . Social connections:    Talks on phone: Not on file    Gets together: Not on file    Attends religious service: Not on file    Active member of club or organization: Not on file    Attends meetings of clubs or organizations: Not on file    Relationship status: Not on file  . Intimate partner violence:    Fear of current or ex partner: Not on file    Emotionally abused: Not on file    Physically abused: Not on file    Forced sexual activity: Not on file  Other Topics Concern  . Not on file  Social History Narrative  . Not on file     Observations/Objective: No distress on phone, speaking normally  Assessment and Plan: Chronic kidney disease, unspecified CKD stage - Plan: CBC with Differential/Platelet, Comprehensive metabolic panel HTN  -  Stable, tolerating current regimen. Medications refilled. Labs pending as above.   -Avoid nephrotoxins, maintain hydration, follow-up with nephrologist as planned.  Hypercholesteremia - Plan: Lipid panel  Stable, tolerating current regimen. Medications refilled. Labs pending as above.    Follow Up Instructions:  6 months.   Patient Instructions   Good talking to you today.  No change in blood pressure or cholesterol medicines based on your home readings.  Keep appointment with nephrologist as planned.  I will let you know if there are any changes needed  once I review your lab results.  Follow-up in 6 months, but please let me know if there are questions in the meantime.    If you have lab work done today you will be contacted with your lab results within the next 2 weeks.  If you have not heard from [place] then please contact us. The fastest way to get your results is to register for My Chart.   IF you received an x-ray today, you will receive an invoice from [facility]. Please contact [facility] Radiology at [phone] with questions or concerns regarding your invoice.   IF you received labwork today, you will receive an invoice from [place]. Please contact [facility] at [phone] with questions or concerns regarding  your invoice.   Our billing staff will not be able to assist you with questions regarding bills from these companies.  You will be contacted with the lab results as soon as they are available. The fastest way to get your results is to activate your My Chart account. Instructions are located on the last page of this paperwork. If you have not heard from [place] regarding the results in 2 weeks, please contact this office.        I discussed the assessment and treatment plan with the patient. The patient was provided an opportunity to ask questions and all were answered. The patient agreed with the plan and demonstrated an understanding of the instructions.   The patient was advised to call back or seek an in-person evaluation if the symptoms worsen or if the condition fails to improve as anticipated.  I provided 6 minutes of non-face-to-face time during this encounter.  Signed,   [name], MD Primary Care at [place].  03/14/19

## 2019-03-15 LAB — COMPREHENSIVE METABOLIC PANEL
ALT: 18 IU/L (ref 0–44)
AST: 25 IU/L (ref 0–40)
Albumin/Globulin Ratio: 2.1 (ref 1.2–2.2)
Albumin: 5 g/dL — ABNORMAL HIGH (ref 3.8–4.8)
Alkaline Phosphatase: 51 IU/L (ref 39–117)
BUN/Creatinine Ratio: 16 (ref 10–24)
BUN: 39 mg/dL — ABNORMAL HIGH (ref 8–27)
Bilirubin Total: 0.4 mg/dL (ref 0.0–1.2)
CO2: 23 mmol/L (ref 20–29)
Calcium: 10.4 mg/dL — ABNORMAL HIGH (ref 8.6–10.2)
Chloride: 103 mmol/L (ref 96–106)
Creatinine, Ser: 2.37 mg/dL — ABNORMAL HIGH (ref 0.76–1.27)
GFR calc Af Amer: 32 mL/min/{1.73_m2} — ABNORMAL LOW (ref >59)
GFR calc non Af Amer: 27 mL/min/{1.73_m2} — ABNORMAL LOW (ref >59)
Globulin, Total: 2.4 g/dL (ref 1.5–4.5)
Glucose: 100 mg/dL — ABNORMAL HIGH (ref 65–99)
Potassium: 4.2 mmol/L (ref 3.5–5.2)
Sodium: 143 mmol/L (ref 134–144)
Total Protein: 7.4 g/dL (ref 6.0–8.5)

## 2019-03-15 LAB — CBC WITH DIFFERENTIAL/PLATELET
Basophils Absolute: 0 10*3/uL (ref 0.0–0.2)
Basos: 1 %
EOS (ABSOLUTE): 0 10*3/uL (ref 0.0–0.4)
Eos: 0 %
Hematocrit: 34.9 % — ABNORMAL LOW (ref 37.5–51.0)
Hemoglobin: 11.8 g/dL — ABNORMAL LOW (ref 13.0–17.7)
Immature Grans (Abs): 0 10*3/uL (ref 0.0–0.1)
Immature Granulocytes: 0 %
Lymphocytes Absolute: 1.3 10*3/uL (ref 0.7–3.1)
Lymphs: 27 %
MCH: 28.7 pg (ref 26.6–33.0)
MCHC: 33.8 g/dL (ref 31.5–35.7)
MCV: 85 fL (ref 79–97)
Monocytes Absolute: 0.6 10*3/uL (ref 0.1–0.9)
Monocytes: 13 %
Neutrophils Absolute: 2.8 10*3/uL (ref 1.4–7.0)
Neutrophils: 59 %
Platelets: 172 10*3/uL (ref 150–450)
RBC: 4.11 x10E6/uL — ABNORMAL LOW (ref 4.14–5.80)
RDW: 12.6 % (ref 11.6–15.4)
WBC: 4.7 10*3/uL (ref 3.4–10.8)

## 2019-03-15 LAB — LIPID PANEL
Chol/HDL Ratio: 2.2 ratio (ref 0.0–5.0)
Cholesterol, Total: 166 mg/dL (ref 100–199)
HDL: 76 mg/dL (ref >39)
LDL Calculated: 82 mg/dL (ref 0–99)
Triglycerides: 41 mg/dL (ref 0–149)
VLDL Cholesterol Cal: 8 mg/dL (ref 5–40)

## 2019-04-05 DIAGNOSIS — N183 Chronic kidney disease, stage 3 (moderate): Secondary | ICD-10-CM | POA: Diagnosis not present

## 2019-04-05 DIAGNOSIS — I129 Hypertensive chronic kidney disease with stage 1 through stage 4 chronic kidney disease, or unspecified chronic kidney disease: Secondary | ICD-10-CM | POA: Diagnosis not present

## 2019-04-05 DIAGNOSIS — E7800 Pure hypercholesterolemia, unspecified: Secondary | ICD-10-CM | POA: Diagnosis not present

## 2019-04-05 DIAGNOSIS — D649 Anemia, unspecified: Secondary | ICD-10-CM | POA: Diagnosis not present

## 2019-04-05 DIAGNOSIS — Z6830 Body mass index (BMI) 30.0-30.9, adult: Secondary | ICD-10-CM | POA: Diagnosis not present

## 2019-05-18 DIAGNOSIS — H40053 Ocular hypertension, bilateral: Secondary | ICD-10-CM | POA: Diagnosis not present

## 2019-05-18 DIAGNOSIS — H04123 Dry eye syndrome of bilateral lacrimal glands: Secondary | ICD-10-CM | POA: Diagnosis not present

## 2019-05-18 DIAGNOSIS — H26493 Other secondary cataract, bilateral: Secondary | ICD-10-CM | POA: Diagnosis not present

## 2019-05-18 DIAGNOSIS — Z961 Presence of intraocular lens: Secondary | ICD-10-CM | POA: Diagnosis not present

## 2019-06-14 DIAGNOSIS — D649 Anemia, unspecified: Secondary | ICD-10-CM | POA: Diagnosis not present

## 2019-06-14 DIAGNOSIS — N183 Chronic kidney disease, stage 3 (moderate): Secondary | ICD-10-CM | POA: Diagnosis not present

## 2019-07-25 DIAGNOSIS — N183 Chronic kidney disease, stage 3 (moderate): Secondary | ICD-10-CM | POA: Diagnosis not present

## 2019-08-17 DIAGNOSIS — H40053 Ocular hypertension, bilateral: Secondary | ICD-10-CM | POA: Diagnosis not present

## 2019-08-17 DIAGNOSIS — H04123 Dry eye syndrome of bilateral lacrimal glands: Secondary | ICD-10-CM | POA: Diagnosis not present

## 2019-08-17 DIAGNOSIS — Z961 Presence of intraocular lens: Secondary | ICD-10-CM | POA: Diagnosis not present

## 2019-08-17 DIAGNOSIS — H26493 Other secondary cataract, bilateral: Secondary | ICD-10-CM | POA: Diagnosis not present

## 2019-09-15 DIAGNOSIS — Z23 Encounter for immunization: Secondary | ICD-10-CM | POA: Diagnosis not present

## 2019-09-26 DIAGNOSIS — H04123 Dry eye syndrome of bilateral lacrimal glands: Secondary | ICD-10-CM | POA: Diagnosis not present

## 2019-09-26 DIAGNOSIS — H401131 Primary open-angle glaucoma, bilateral, mild stage: Secondary | ICD-10-CM | POA: Diagnosis not present

## 2019-09-26 DIAGNOSIS — Z961 Presence of intraocular lens: Secondary | ICD-10-CM | POA: Diagnosis not present

## 2019-09-26 DIAGNOSIS — H26493 Other secondary cataract, bilateral: Secondary | ICD-10-CM | POA: Diagnosis not present

## 2019-10-06 DIAGNOSIS — N183 Chronic kidney disease, stage 3 unspecified: Secondary | ICD-10-CM | POA: Diagnosis not present

## 2019-10-06 DIAGNOSIS — D472 Monoclonal gammopathy: Secondary | ICD-10-CM | POA: Diagnosis not present

## 2019-10-06 DIAGNOSIS — C61 Malignant neoplasm of prostate: Secondary | ICD-10-CM | POA: Diagnosis not present

## 2019-10-06 DIAGNOSIS — N189 Chronic kidney disease, unspecified: Secondary | ICD-10-CM | POA: Diagnosis not present

## 2019-10-06 DIAGNOSIS — I129 Hypertensive chronic kidney disease with stage 1 through stage 4 chronic kidney disease, or unspecified chronic kidney disease: Secondary | ICD-10-CM | POA: Diagnosis not present

## 2019-10-06 DIAGNOSIS — N2581 Secondary hyperparathyroidism of renal origin: Secondary | ICD-10-CM | POA: Diagnosis not present

## 2019-10-06 DIAGNOSIS — D631 Anemia in chronic kidney disease: Secondary | ICD-10-CM | POA: Diagnosis not present

## 2019-10-13 ENCOUNTER — Telehealth: Payer: Self-pay | Admitting: *Deleted

## 2019-10-13 NOTE — Telephone Encounter (Signed)
Schedule AWV.  

## 2019-10-17 ENCOUNTER — Telehealth (INDEPENDENT_AMBULATORY_CARE_PROVIDER_SITE_OTHER): Payer: Medicare Other | Admitting: Family Medicine

## 2019-10-17 ENCOUNTER — Other Ambulatory Visit: Payer: Self-pay

## 2019-10-17 DIAGNOSIS — E7800 Pure hypercholesterolemia, unspecified: Secondary | ICD-10-CM | POA: Diagnosis not present

## 2019-10-17 DIAGNOSIS — N189 Chronic kidney disease, unspecified: Secondary | ICD-10-CM

## 2019-10-17 DIAGNOSIS — I10 Essential (primary) hypertension: Secondary | ICD-10-CM | POA: Diagnosis not present

## 2019-10-17 MED ORDER — PRAVASTATIN SODIUM 40 MG PO TABS: 40.0000 mg | ORAL_TABLET | Freq: Every morning | ORAL | 2 refills | Status: DC

## 2019-10-17 MED ORDER — VERAPAMIL HCL ER 180 MG PO TBCR: 180.0000 mg | EXTENDED_RELEASE_TABLET | Freq: Every day | ORAL | 2 refills | Status: DC

## 2019-10-17 NOTE — Progress Notes (Signed)
Virtual Visit via Telephone Note  I connected with Alan Shah on 10/17/19 at 5:48 PM by telephone and verified that I am speaking with the correct person using two identifiers.   I discussed the limitations, risks, security and privacy concerns of performing an evaluation and management service by telephone and the availability of in person appointments. I also discussed with the patient that there may be a patient responsible charge related to this service. The patient expressed understanding and agreed to proceed, consent obtained  Chief complaint: CKD,  follow up.   History of Present Illness: Alan Shah is a 68 y.o. male  Hypertension: Stage III chronic kidney disease with hypertension, nephrologist Dr. Moshe Cipro with an appointment in May.  Home blood pressure readings stable at that time.  Slowly progressive chronic kidney disease for years, thought to be longstanding hypertension.  Minimal proteinuria.  Ultrasound negative, no secondary hyperparathyroidism or anemia.  With progression, there was a plan for a renal biopsy. Currently takes HCTZ 25 mg daily, verapamil 180 mg daily.  Reports visit with Dr. Moshe Cipro earlier this month.  Stopped HCTZ due to concern for kidneys. Still on verapamil once per day. Planned injection this Friday per nephrology - Short Stay.  Not sure about biopsy - waiting for Covid cases to calm down.    Home readings: BP Readings from Last 3 Encounters:  03/14/19 128/76  09/06/18 122/72  03/04/18 122/62   Lab Results  Component Value Date   CREATININE 2.37 (H) 03/14/2019   Hyperlipidemia: Pravastatin 40 mg daily. No new side effects/myalgias.   Lab Results  Component Value Date   CHOL 166 03/14/2019   HDL 76 03/14/2019   LDLCALC 82 03/14/2019   LDLDIRECT 70 07/06/2013   TRIG 41 03/14/2019   CHOLHDL 2.2 03/14/2019   Lab Results  Component Value Date   ALT 18 03/14/2019   AST 25 03/14/2019   ALKPHOS 51 03/14/2019   BILITOT  0.4 03/14/2019   Flu vaccine: 09/15/19.      Patient Active Problem List   Diagnosis Date Noted  . Anemia in chronic kidney disease 02/13/2016  . Stage T1c Prostate Cancer, Gleasons 3+4, PSA 8.2  s/p Prostate Seed Implant 09/08/13 10/21/2013  . Hypercholesteremia 01/10/2013  . Unspecified vitamin D deficiency 01/10/2013  . Hypertrophy of prostate without urinary obstruction and other lower urinary tract symptoms (LUTS) 01/10/2013  . HTN (hypertension) 06/01/2012  . Overweight (BMI 25.0-29.9) 06/01/2012   Past Medical History:  Diagnosis Date  . Hyperlipidemia   . Hypertension   . Prostate cancer [facility])    adenocarcinoma gleason 7   Past Surgical History:  Procedure Laterality Date  . COLONOSCOPY    . ORIF DISPLACED RIGHT TWO-PART PROXIMAL HUMEROUS FX  03-05-2010  . PROSTATE BIOPSY  02/24/2013  &   06-07-2013  . PROSTATE SURGERY    . RADIOACTIVE SEED IMPLANT N/A 09/08/2013   Procedure: RADIOACTIVE SEED IMPLANT;  Surgeon: Claybon Jabs, MD;  Location: [facility];  Service: Urology;  Laterality: N/A;  . RIGHT SHOULDER SURGER FOR FX  2010   No Known Allergies Prior to Admission medications   Medication Sig Start Date End Date Taking? Authorizing Provider  aspirin 81 MG tablet Take 81 mg by mouth daily.    [provider]  CIALIS 20 MG tablet  12/12/15   [provider]  hydrochlorothiazide (HYDRODIURIL) 25 MG tablet Take 1 tablet (25 mg total) by mouth daily. 03/14/19   Wendie Agreste, MD  Multiple Vitamin (MULTIVITAMIN) tablet Take 1 tablet by mouth daily.    [provider]  pravastatin (PRAVACHOL) 40 MG tablet Take 1 tablet (40 mg total) by mouth every morning. 03/14/19   Wendie Agreste, MD  verapamil (CALAN-SR) 180 MG CR tablet Take 1 tablet (180 mg total) by mouth at bedtime. 03/14/19   Wendie Agreste, MD   Social History   Socioeconomic History  . Marital status: Single    Spouse name: Not on file  . Number of children:  Not on file  . Years of education: Not on file  . Highest education level: Not on file  Occupational History  . Occupation: RETIRED  Social Needs  . Financial resource strain: Not on file  . Food insecurity    Worry: Not on file    Inability: Not on file  . Transportation needs    Medical: Not on file    Non-medical: Not on file  Tobacco Use  . Smoking status: Never Smoker  . Smokeless tobacco: Never Used  Substance and Sexual Activity  . Alcohol use: Yes    Alcohol/week: 1.0 standard drinks    Types: 1 Standard drinks or equivalent per week    Comment: RARE  . Drug use: No  . Sexual activity: Yes  Lifestyle  . Physical activity    Days per week: Not on file    Minutes per session: Not on file  . Stress: Not on file  Relationships  . Social Herbalist on phone: Not on file    Gets together: Not on file    Attends religious service: Not on file    Active member of club or organization: Not on file    Attends meetings of clubs or organizations: Not on file    Relationship status: Not on file  . Intimate partner violence    Fear of current or ex partner: Not on file    Emotionally abused: Not on file    Physically abused: Not on file    Forced sexual activity: Not on file  Other Topics Concern  . Not on file  Social History Narrative  . Not on file     Observations/Objective: No distress.  Appropriate responses.  All questions answered.  No home vitals.  There were no vitals filed for this visit.    Assessment and Plan: Chronic kidney disease, unspecified CKD stage Essential hypertension - Plan: verapamil (CALAN-SR) 180 MG CR tablet  -Managed by nephrology.  Now just on verapamil per his report.  Recent nephrology visit to be reviewed.  No changes.  Hypercholesteremia - Plan: Lipid panel, Hepatic Function Panel, pravastatin (PRAVACHOL) 40 MG tablet  -Tolerating pravastatin, continue same dose.  Unsure if he has had recent lipid panel.  Lab only  visit ordered if no recent testing.  Recheck 6 to 9 months  Follow Up Instructions:   6-40months.  I discussed the assessment and treatment plan with the patient. The patient was provided an opportunity to ask questions and all were answered. The patient agreed with the plan and demonstrated an understanding of the instructions.   The patient was advised to call back or seek an in-person evaluation if the symptoms worsen or if the condition fails to improve as anticipated.  I provided 14 minutes of non-face-to-face time during this encounter.  Signed,   [name], MD [facility].  10/17/19

## 2019-10-17 NOTE — Progress Notes (Signed)
CC- 28month f/u- For kidney infection. Have been seen by kidney specialist. Patient stated he is not having any issues at this time.

## 2019-10-20 ENCOUNTER — Other Ambulatory Visit (HOSPITAL_COMMUNITY): Payer: Self-pay | Admitting: *Deleted

## 2019-10-21 ENCOUNTER — Encounter (HOSPITAL_COMMUNITY)
Admission: RE | Admit: 2019-10-21 | Discharge: 2019-10-21 | Disposition: A | Discharge status: Home / Self Care | Payer: Medicare Other | Source: Ambulatory Visit | Attending: Nephrology | Admitting: Nephrology

## 2019-10-21 ENCOUNTER — Other Ambulatory Visit: Payer: Self-pay

## 2019-10-21 DIAGNOSIS — D631 Anemia in chronic kidney disease: Secondary | ICD-10-CM | POA: Insufficient documentation

## 2019-10-21 MED ORDER — SODIUM CHLORIDE 0.9 % IV SOLN
510.0000 mg | INTRAVENOUS | Status: DC
  Administered 2019-10-21: 510 mg via INTRAVENOUS
  Filled 2019-10-21: qty 510

## 2019-10-21 NOTE — Discharge Instructions (Signed)

## 2019-10-26 ENCOUNTER — Other Ambulatory Visit: Payer: Self-pay

## 2019-10-26 ENCOUNTER — Ambulatory Visit (INDEPENDENT_AMBULATORY_CARE_PROVIDER_SITE_OTHER): Payer: Medicare Other | Admitting: Family Medicine

## 2019-10-26 DIAGNOSIS — E7800 Pure hypercholesterolemia, unspecified: Secondary | ICD-10-CM

## 2019-10-26 NOTE — Progress Notes (Signed)
Nurse visit only - patient not seen.

## 2019-10-27 LAB — HEPATIC FUNCTION PANEL
ALT: 39 IU/L (ref 0–44)
AST: 38 IU/L (ref 0–40)
Albumin: 4.8 g/dL (ref 3.8–4.8)
Alkaline Phosphatase: 61 IU/L (ref 39–117)
Bilirubin Total: 0.4 mg/dL (ref 0.0–1.2)
Bilirubin, Direct: 0.15 mg/dL (ref 0.00–0.40)
Total Protein: 7.3 g/dL (ref 6.0–8.5)

## 2019-10-27 LAB — LIPID PANEL
Chol/HDL Ratio: 2 ratio (ref 0.0–5.0)
Cholesterol, Total: 182 mg/dL (ref 100–199)
HDL: 90 mg/dL (ref >39)
LDL Chol Calc (NIH): 84 mg/dL (ref 0–99)
Triglycerides: 38 mg/dL (ref 0–149)
VLDL Cholesterol Cal: 8 mg/dL (ref 5–40)

## 2019-10-31 ENCOUNTER — Encounter (HOSPITAL_COMMUNITY): Payer: BLUE CROSS/BLUE SHIELD

## 2019-11-01 ENCOUNTER — Other Ambulatory Visit (HOSPITAL_COMMUNITY): Payer: Self-pay | Admitting: *Deleted

## 2019-11-02 ENCOUNTER — Encounter: Payer: Self-pay | Admitting: Radiology

## 2019-11-02 ENCOUNTER — Encounter (HOSPITAL_COMMUNITY)
Admission: RE | Admit: 2019-11-02 | Discharge: 2019-11-02 | Disposition: A | Discharge status: Home / Self Care | Payer: Medicare Other | Source: Ambulatory Visit | Attending: Nephrology | Admitting: Nephrology

## 2019-11-02 ENCOUNTER — Other Ambulatory Visit: Payer: Self-pay

## 2019-11-02 DIAGNOSIS — D631 Anemia in chronic kidney disease: Secondary | ICD-10-CM | POA: Diagnosis not present

## 2019-11-02 MED ORDER — SODIUM CHLORIDE 0.9 % IV SOLN
510.0000 mg | INTRAVENOUS | Status: DC
  Administered 2019-11-02: 510 mg via INTRAVENOUS
  Filled 2019-11-02: qty 17

## 2019-11-08 ENCOUNTER — Telehealth: Payer: Self-pay | Admitting: *Deleted

## 2019-11-08 NOTE — Telephone Encounter (Signed)
Schedule AWV.  

## 2019-11-09 ENCOUNTER — Inpatient Hospital Stay (HOSPITAL_COMMUNITY): Admission: RE | Admit: 2019-11-09 | Payer: BLUE CROSS/BLUE SHIELD | Source: Ambulatory Visit

## 2019-11-16 ENCOUNTER — Encounter (HOSPITAL_COMMUNITY): Payer: BLUE CROSS/BLUE SHIELD

## 2019-12-04 ENCOUNTER — Other Ambulatory Visit: Payer: Self-pay | Admitting: Family Medicine

## 2019-12-04 DIAGNOSIS — I10 Essential (primary) hypertension: Secondary | ICD-10-CM

## 2019-12-06 ENCOUNTER — Other Ambulatory Visit: Payer: Self-pay | Admitting: Family Medicine

## 2019-12-06 DIAGNOSIS — E7800 Pure hypercholesterolemia, unspecified: Secondary | ICD-10-CM

## 2019-12-12 ENCOUNTER — Other Ambulatory Visit: Payer: Self-pay | Admitting: Family Medicine

## 2019-12-12 DIAGNOSIS — I10 Essential (primary) hypertension: Secondary | ICD-10-CM

## 2019-12-12 NOTE — Telephone Encounter (Signed)
Medication Refill - Medication: verpamil      (Agent: If no, request that the patient contact the pharmacy for the refill.) Yes. Pt states he is supposed to have a year supply sent into pharmacy. Please advise.  (Agent: If yes, when and what did the pharmacy advise?)  Preferred Pharmacy (with phone number or street name):  [id number] [place]  [address] [place] [address]  Phone: [phone] Fax: [phone]  Not a 24 hour pharmacy; exact hours not known.     Agent: Please be advised that RX refills may take up to 3 business days. We ask that you follow-up with your pharmacy.

## 2019-12-13 DIAGNOSIS — C61 Malignant neoplasm of prostate: Secondary | ICD-10-CM | POA: Diagnosis not present

## 2019-12-20 DIAGNOSIS — Z8546 Personal history of malignant neoplasm of prostate: Secondary | ICD-10-CM | POA: Diagnosis not present

## 2019-12-20 DIAGNOSIS — N400 Enlarged prostate without lower urinary tract symptoms: Secondary | ICD-10-CM | POA: Diagnosis not present

## 2020-01-30 DIAGNOSIS — H401131 Primary open-angle glaucoma, bilateral, mild stage: Secondary | ICD-10-CM | POA: Diagnosis not present

## 2020-01-30 DIAGNOSIS — H04123 Dry eye syndrome of bilateral lacrimal glands: Secondary | ICD-10-CM | POA: Diagnosis not present

## 2020-01-30 DIAGNOSIS — H26493 Other secondary cataract, bilateral: Secondary | ICD-10-CM | POA: Diagnosis not present

## 2020-01-30 DIAGNOSIS — Z961 Presence of intraocular lens: Secondary | ICD-10-CM | POA: Diagnosis not present

## 2020-03-12 ENCOUNTER — Telehealth: Payer: Self-pay | Admitting: *Deleted

## 2020-03-12 NOTE — Telephone Encounter (Signed)
Schedule AWV.  

## 2020-04-18 ENCOUNTER — Other Ambulatory Visit: Payer: Self-pay

## 2020-04-18 ENCOUNTER — Encounter: Payer: Self-pay | Admitting: Family Medicine

## 2020-04-18 ENCOUNTER — Ambulatory Visit (INDEPENDENT_AMBULATORY_CARE_PROVIDER_SITE_OTHER): Payer: Medicare Other | Admitting: Family Medicine

## 2020-04-18 VITALS — BP 132/74 | HR 73 | Temp 98.0°F | Resp 15 | Ht 68.0 in | Wt 183.0 lb

## 2020-04-18 DIAGNOSIS — N189 Chronic kidney disease, unspecified: Secondary | ICD-10-CM

## 2020-04-18 DIAGNOSIS — I10 Essential (primary) hypertension: Secondary | ICD-10-CM

## 2020-04-18 DIAGNOSIS — E7800 Pure hypercholesterolemia, unspecified: Secondary | ICD-10-CM | POA: Diagnosis not present

## 2020-04-18 LAB — COMPREHENSIVE METABOLIC PANEL
ALT: 23 IU/L (ref 0–44)
AST: 25 IU/L (ref 0–40)
Albumin/Globulin Ratio: 1.8 (ref 1.2–2.2)
Albumin: 4.8 g/dL (ref 3.8–4.8)
Alkaline Phosphatase: 59 IU/L (ref 48–121)
BUN/Creatinine Ratio: 15 (ref 10–24)
BUN: 47 mg/dL — ABNORMAL HIGH (ref 8–27)
Bilirubin Total: 0.3 mg/dL (ref 0.0–1.2)
CO2: 25 mmol/L (ref 20–29)
Calcium: 10.3 mg/dL — ABNORMAL HIGH (ref 8.6–10.2)
Chloride: 101 mmol/L (ref 96–106)
Creatinine, Ser: 3.1 mg/dL (ref 0.76–1.27)
GFR calc Af Amer: 23 mL/min/{1.73_m2} — ABNORMAL LOW (ref >59)
GFR calc non Af Amer: 20 mL/min/{1.73_m2} — ABNORMAL LOW (ref >59)
Globulin, Total: 2.6 g/dL (ref 1.5–4.5)
Glucose: 101 mg/dL — ABNORMAL HIGH (ref 65–99)
Potassium: 4.3 mmol/L (ref 3.5–5.2)
Sodium: 139 mmol/L (ref 134–144)
Total Protein: 7.4 g/dL (ref 6.0–8.5)

## 2020-04-18 LAB — LIPID PANEL
Chol/HDL Ratio: 2.3 ratio (ref 0.0–5.0)
Cholesterol, Total: 179 mg/dL (ref 100–199)
HDL: 79 mg/dL (ref >39)
LDL Chol Calc (NIH): 90 mg/dL (ref 0–99)
Triglycerides: 47 mg/dL (ref 0–149)
VLDL Cholesterol Cal: 10 mg/dL (ref 5–40)

## 2020-04-18 MED ORDER — VERAPAMIL HCL ER 180 MG PO TBCR: 180.0000 mg | EXTENDED_RELEASE_TABLET | Freq: Every day | ORAL | 2 refills | Status: DC

## 2020-04-18 MED ORDER — PRAVASTATIN SODIUM 40 MG PO TABS: ORAL_TABLET | ORAL | 2 refills | Status: DC

## 2020-04-18 NOTE — Patient Instructions (Addendum)
  No medication changes at this time.  I will let you know if there are any concerns on your blood work.  Follow-up with nephrology as planned.  Let me know if there are questions and take care.  If you have lab work done today you will be contacted with your lab results within the next 2 weeks.  If you have not heard from [place] then please contact us. The fastest way to get your results is to register for My Chart.   IF you received an x-ray today, you will receive an invoice from [facility]. Please contact [facility] at [phone] with questions or concerns regarding your invoice.   IF you received labwork today, you will receive an invoice from [place]. Please contact [facility] at [phone] with questions or concerns regarding your invoice.   Our billing staff will not be able to assist you with questions regarding bills from these companies.  You will be contacted with the lab results as soon as they are available. The fastest way to get your results is to activate your My Chart account. Instructions are located on the last page of this paperwork. If you have not heard from [place] regarding the results in 2 weeks, please contact this office.

## 2020-04-18 NOTE — Progress Notes (Addendum)
Subjective:  Patient ID: Alan Shah, male    DOB: [birth year]  Age: 69 y.o. MRN: [id number]  CC:  Chief Complaint  Patient presents with  . Hyperlipidemia    pt has been working out daily and try to eat better since last visit to help his cholesterole  . Hypertension    pt states since stopping the HCTZ (per Dr Lyda Kalata) his bp has been increasing   . Chronic Kidney Disease    pt is seeing Dr Lyda Kalata next month to follow up as well, pt states Dr Lyda Kalata stopped his hydrochlorothiazide    HPI [name] presents for   Hypertension: With chronic kidney disease, followed by nephrology Dr. Lyda Kalata.  Now off HCTZ xince OV with Dr. Lyda Kalata in November. appt June 16th with nephrology taking verapamil 180 mg controlled release daily. Home readings: 130/80 range.  No news side effects with meds.   S/p both covid vaccines.   BP Readings from Last 3 Encounters:  04/18/20 132/74  11/02/19 139/80  10/21/19 (!) 150/81   Lab Results  Component Value Date   CREATININE 2.37 (H) 03/14/2019   History of prostate cancer followed by urology, appointment in January reviewed with PSA 0.052  Hyperlipidemia: Pravastatin 40 mg daily. No new myalgias.  Lab Results  Component Value Date   CHOL 182 10/26/2019   HDL 90 10/26/2019   LDLCALC 84 10/26/2019   LDLDIRECT 70 07/06/2013   TRIG 38 10/26/2019   CHOLHDL 2.0 10/26/2019   Lab Results  Component Value Date   ALT 39 10/26/2019   AST 38 10/26/2019   ALKPHOS 61 10/26/2019   BILITOT 0.4 10/26/2019    History Patient Active Problem List   Diagnosis Date Noted  . Anemia in chronic kidney disease 02/13/2016  . Stage T1c Prostate Cancer, Gleasons 3+4, PSA 8.2  s/p Prostate Seed Implant 09/08/13 10/21/2013  . Hypercholesteremia 01/10/2013  . Unspecified vitamin D deficiency 01/10/2013  . Hypertrophy of prostate without urinary obstruction and other lower urinary tract symptoms (LUTS) 01/10/2013  . HTN (hypertension) 06/01/2012  .  Overweight (BMI 25.0-29.9) 06/01/2012   Past Medical History:  Diagnosis Date  . Hyperlipidemia   . Hypertension   . Prostate cancer [facility])    adenocarcinoma gleason 7   Past Surgical History:  Procedure Laterality Date  . COLONOSCOPY    . ORIF DISPLACED RIGHT TWO-PART PROXIMAL HUMEROUS FX  03-05-2010  . PROSTATE BIOPSY  02/24/2013  &   06-07-2013  . PROSTATE SURGERY    . RADIOACTIVE SEED IMPLANT N/A 09/08/2013   Procedure: RADIOACTIVE SEED IMPLANT;  Surgeon: Claybon Jabs, MD;  Location: [facility];  Service: Urology;  Laterality: N/A;  . RIGHT SHOULDER SURGER FOR FX  2010   No Known Allergies Prior to Admission medications   Medication Sig Start Date End Date Taking? Authorizing Provider  aspirin 81 MG tablet Take 81 mg by mouth daily.   Yes [provider]  CIALIS 20 MG tablet  12/12/15  Yes [provider]  latanoprost (XALATAN) 0.005 % ophthalmic solution Place 2 drops into the left eye once. 04/05/20  Yes [provider]  Multiple Vitamin (MULTIVITAMIN) tablet Take 1 tablet by mouth daily.   Yes [provider]  pravastatin (PRAVACHOL) 40 MG tablet TAKE 1 TABLET(40 MG) BY MOUTH EVERY MORNING 12/06/19  Yes Wendie Agreste, MD  verapamil (CALAN-SR) 180 MG CR tablet Take 1 tablet (180 mg total) by mouth at bedtime. 10/17/19  Yes [name]  R, MD   Social History   Socioeconomic History  . Marital status: Single    Spouse name: Not on file  . Number of children: Not on file  . Years of education: Not on file  . Highest education level: Not on file  Occupational History  . Occupation: RETIRED  Tobacco Use  . Smoking status: Never Smoker  . Smokeless tobacco: Never Used  Substance and Sexual Activity  . Alcohol use: Yes    Alcohol/week: 1.0 standard drinks    Types: 1 Standard drinks or equivalent per week    Comment: RARE  . Drug use: No  . Sexual activity: Yes  Other Topics Concern  . Not on file  Social History  Narrative  . Not on file   Social Determinants of Health   Financial Resource Strain:   . Difficulty of Paying Living Expenses:   Food Insecurity:   . Worried About Charity fundraiser in the Last Year:   . Arboriculturist in the Last Year:   Transportation Needs:   . Film/video editor (Medical):   [name] Lack of Transportation (Non-Medical):   Physical Activity:   . Days of Exercise per Week:   . Minutes of Exercise per Session:   Stress:   . Feeling of Stress :   Social Connections:   . Frequency of Communication with Friends and Family:   . Frequency of Social Gatherings with Friends and Family:   . Attends Religious Services:   . Active Member of Clubs or Organizations:   . Attends Archivist Meetings:   [name] Marital Status:   Intimate Partner Violence:   . Fear of Current or Ex-Partner:   . Emotionally Abused:   [name] Physically Abused:   . Sexually Abused:     Review of Systems  Constitutional: Negative for fatigue and unexpected weight change.  Eyes: Negative for visual disturbance.  Respiratory: Negative for cough, chest tightness and shortness of breath.   Cardiovascular: Negative for chest pain, palpitations and leg swelling.  Gastrointestinal: Negative for abdominal pain and blood in stool.  Neurological: Negative for dizziness, light-headedness and headaches.   Per HPI   Objective:   Vitals:   04/18/20 0758 04/18/20 0803  BP: (!) 143/80 132/74  Pulse: 73   Resp: 15   Temp: 98 F (36.7 C)   TempSrc: Temporal   SpO2: 100%   Weight: 183 lb (83 kg)   Height: 5\' 8"  (1.727 m)      Physical Exam Vitals reviewed.  Constitutional:      Appearance: He is well-developed.  HENT:     Head: Normocephalic and atraumatic.  Eyes:     Pupils: Pupils are equal, round, and reactive to light.  Neck:     Vascular: No carotid bruit or JVD.  Cardiovascular:     Rate and Rhythm: Normal rate and regular rhythm.     Heart sounds: Normal heart sounds. No  murmur.  Pulmonary:     Effort: Pulmonary effort is normal.     Breath sounds: Normal breath sounds. No rales.  Abdominal:     General: Abdomen is flat.     Tenderness: There is no abdominal tenderness. There is no guarding.  Skin:    General: Skin is warm and dry.  Neurological:     Mental Status: He is alert and oriented to person, place, and time.  Psychiatric:        Mood and Affect: Mood normal.  Behavior: Behavior normal.    Assessment & Plan:  FURMAN TRENTMAN is a 69 y.o. male . Chronic kidney disease, unspecified CKD stage - Plan: Comprehensive metabolic panel Essential hypertension - Plan: verapamil (CALAN-SR) 180 MG CR tablet  - stable, continue same meds, labs pending, follow up with nephrology as planned.   Hypercholesteremia - Plan: pravastatin (PRAVACHOL) 40 MG tablet, Lipid panel  -  Stable, tolerating current regimen. Medications refilled. Labs pending as above.    Meds ordered this encounter  Medications  . verapamil (CALAN-SR) 180 MG CR tablet    Sig: Take 1 tablet (180 mg total) by mouth at bedtime.    Dispense:  90 tablet    Refill:  2  . pravastatin (PRAVACHOL) 40 MG tablet    Sig: TAKE 1 TABLET(40 MG) BY MOUTH EVERY MORNING    Dispense:  90 tablet    Refill:  2    Ok to place on hold.   Patient Instructions    No medication changes at this time.  I will let you know if there are any concerns on your blood work.  Follow-up with nephrology as planned.  Let me know if there are questions and take care.  If you have lab work done today you will be contacted with your lab results within the next 2 weeks.  If you have not heard from [place] then please contact us. The fastest way to get your results is to register for My Chart.   IF you received an x-ray today, you will receive an invoice from [facility]. Please contact [facility] at [phone] with questions or concerns regarding your invoice.   IF you received labwork today, you  will receive an invoice from [place]. Please contact [facility] at [phone] with questions or concerns regarding your invoice.   Our billing staff will not be able to assist you with questions regarding bills from these companies.  You will be contacted with the lab results as soon as they are available. The fastest way to get your results is to activate your My Chart account. Instructions are located on the last page of this paperwork. If you have not heard from [place] regarding the results in 2 weeks, please contact this office.         Signed, [name], MD [facility]   9:37 AM 04/19/20: Stat lab call regarding creatinine.  3.10 yesterday. Last creatinine at nephrology was 2.47 on October 06, 2019.  Has follow-up in June. Called patient with results. LMOM to call back.  Results for orders placed or performed in visit on 04/18/20  Comprehensive metabolic panel  Result Value Ref Range   Glucose 101 (H) 65 - 99 mg/dL   BUN 47 (H) 8 - 27 mg/dL   Creatinine, Ser 3.10 (HH) 0.76 - 1.27 mg/dL   GFR calc non Af Amer 20 (L) >59 mL/min/1.73   GFR calc Af Amer 23 (L) >59 mL/min/1.73   BUN/Creatinine Ratio 15 10 - 24   Sodium 139 134 - 144 mmol/L   Potassium 4.3 3.5 - 5.2 mmol/L   Chloride 101 96 - 106 mmol/L   CO2 25 20 - 29 mmol/L   Calcium 10.3 (H) 8.6 - 10.2 mg/dL   Total Protein 7.4 6.0 - 8.5 g/dL   Albumin 4.8 3.8 - 4.8 g/dL   Globulin, Total 2.6 1.5 - 4.5 g/dL   Albumin/Globulin Ratio 1.8 1.2 - 2.2   Bilirubin Total 0.3 0.0 -  1.2 mg/dL   Alkaline Phosphatase 59 48 - 121 IU/L   AST 25 0 - 40 IU/L   ALT 23 0 - 44 IU/L  Lipid panel  Result Value Ref Range   Cholesterol, Total 179 100 - 199 mg/dL   Triglycerides 47 0 - 149 mg/dL   HDL 79 >39 mg/dL   VLDL Cholesterol Cal 10 5 - 40 mg/dL   LDL Chol Calc (NIH) 90 0 - 99 mg/dL   Chol/HDL Ratio 2.3 0.0 - 5.0 ratio

## 2020-04-19 ENCOUNTER — Other Ambulatory Visit: Payer: Self-pay | Admitting: Family Medicine

## 2020-04-19 ENCOUNTER — Telehealth: Payer: Self-pay | Admitting: Family Medicine

## 2020-04-19 ENCOUNTER — Other Ambulatory Visit: Payer: Self-pay

## 2020-04-19 ENCOUNTER — Ambulatory Visit (INDEPENDENT_AMBULATORY_CARE_PROVIDER_SITE_OTHER): Payer: Medicare Other | Admitting: Family Medicine

## 2020-04-19 DIAGNOSIS — N189 Chronic kidney disease, unspecified: Secondary | ICD-10-CM

## 2020-04-19 LAB — BASIC METABOLIC PANEL
BUN/Creatinine Ratio: 16 (ref 10–24)
BUN: 48 mg/dL — ABNORMAL HIGH (ref 8–27)
CO2: 22 mmol/L (ref 20–29)
Calcium: 10.1 mg/dL (ref 8.6–10.2)
Chloride: 103 mmol/L (ref 96–106)
Creatinine, Ser: 3.02 mg/dL (ref 0.76–1.27)
GFR calc Af Amer: 23 mL/min/{1.73_m2} — ABNORMAL LOW (ref >59)
GFR calc non Af Amer: 20 mL/min/{1.73_m2} — ABNORMAL LOW (ref >59)
Glucose: 95 mg/dL (ref 65–99)
Potassium: 4.1 mmol/L (ref 3.5–5.2)
Sodium: 144 mmol/L (ref 134–144)

## 2020-04-19 NOTE — Progress Notes (Signed)
Lab visit only. 

## 2020-04-19 NOTE — Progress Notes (Signed)
Bmp lab only for increasing creatinine.

## 2020-05-15 DIAGNOSIS — C61 Malignant neoplasm of prostate: Secondary | ICD-10-CM | POA: Diagnosis not present

## 2020-05-15 DIAGNOSIS — N189 Chronic kidney disease, unspecified: Secondary | ICD-10-CM | POA: Diagnosis not present

## 2020-05-15 DIAGNOSIS — D472 Monoclonal gammopathy: Secondary | ICD-10-CM | POA: Diagnosis not present

## 2020-05-15 DIAGNOSIS — I129 Hypertensive chronic kidney disease with stage 1 through stage 4 chronic kidney disease, or unspecified chronic kidney disease: Secondary | ICD-10-CM | POA: Diagnosis not present

## 2020-05-15 DIAGNOSIS — D631 Anemia in chronic kidney disease: Secondary | ICD-10-CM | POA: Diagnosis not present

## 2020-05-15 DIAGNOSIS — N183 Chronic kidney disease, stage 3 unspecified: Secondary | ICD-10-CM | POA: Diagnosis not present

## 2020-05-15 DIAGNOSIS — N2581 Secondary hyperparathyroidism of renal origin: Secondary | ICD-10-CM | POA: Diagnosis not present

## 2020-06-20 DIAGNOSIS — H26493 Other secondary cataract, bilateral: Secondary | ICD-10-CM | POA: Diagnosis not present

## 2020-06-20 DIAGNOSIS — H04123 Dry eye syndrome of bilateral lacrimal glands: Secondary | ICD-10-CM | POA: Diagnosis not present

## 2020-06-20 DIAGNOSIS — H401131 Primary open-angle glaucoma, bilateral, mild stage: Secondary | ICD-10-CM | POA: Diagnosis not present

## 2020-06-20 DIAGNOSIS — Z961 Presence of intraocular lens: Secondary | ICD-10-CM | POA: Diagnosis not present

## 2020-08-15 ENCOUNTER — Ambulatory Visit (INDEPENDENT_AMBULATORY_CARE_PROVIDER_SITE_OTHER): Payer: Medicare Other | Admitting: Family Medicine

## 2020-08-15 VITALS — BP 132/74 | Ht 68.0 in | Wt 183.0 lb

## 2020-08-15 DIAGNOSIS — Z0000 Encounter for general adult medical examination without abnormal findings: Secondary | ICD-10-CM

## 2020-08-15 NOTE — Progress Notes (Signed)
Presents today for [employer] Visit   Date of last exam:04-19-2020  Interpreter used for this visit? No  I connected with  Alan Shah on 08/15/20 by a telephone and verified that I am speaking with the correct person using two identifiers.   I discussed the limitations of evaluation and management by telemedicine. The patient expressed understanding and agreed to proceed.  Patient location: home  Provider location: in office  I provided 20 minutes of non face - to - face time during this encounter.   Patient Care Team: Wendie Agreste, MD as PCP - General (Family Medicine) Barton Fanny, MD (Inactive) (Family Medicine)   Other items to address today:   Discussed Eye/Dental Discussed immunizations Follow up scheduled Dr. Carlota Raspberry 11-9 @ 8:00am    Other Screening: Last screening for diabetes: 04/18/2020 Last lipid screening: 04/18/2020  ADVANCE DIRECTIVES: Discussed: yes On File: no Materials Provided: yes  Immunization status:  Immunization History  Administered Date(s) Administered   Influenza, High Dose Seasonal PF 09/06/2018   PFIZER SARS-COV-2 Vaccination 01/07/2020, 01/28/2020   Pneumococcal Conjugate-13 01/05/2015   Pneumococcal Polysaccharide-23 02/26/2017   Tdap 04/25/2010   Zoster 09/01/2011     Health Maintenance Due  Topic Date Due   TETANUS/TDAP  04/25/2020   INFLUENZA VACCINE  07/01/2020     Functional Status Survey: Is the patient deaf or have difficulty hearing?: No Does the patient have difficulty seeing, even when wearing glasses/contacts?: No Does the patient have difficulty concentrating, remembering, or making decisions?: No Does the patient have difficulty walking or climbing stairs?: No Does the patient have difficulty dressing or bathing?: No Does the patient have difficulty doing errands alone such as visiting a doctor's office or shopping?: No   6CIT Screen 08/15/2020 09/06/2018  What Year?  0 points 0 points  What month? 0 points 0 points  What time? 0 points 0 points  Count back from 20 0 points 0 points  Months in reverse 0 points 0 points  Repeat phrase 0 points 0 points  Total Score 0 0        Clinical Support from 08/15/2020 in [facility]  AUDIT-C Score 0       Home Environment:   No trouble climbing stairs Lives in a two story home No grab bars  No scattered rugs Adequate lighting/ no clutter   Patient Active Problem List   Diagnosis Date Noted   Anemia in chronic kidney disease 02/13/2016   Stage T1c Prostate Cancer, Gleasons 3+4, PSA 8.2  s/p Prostate Seed Implant 09/08/13 10/21/2013   Hypercholesteremia 01/10/2013   Unspecified vitamin D deficiency 01/10/2013   Hypertrophy of prostate without urinary obstruction and other lower urinary tract symptoms (LUTS) 01/10/2013   HTN (hypertension) 06/01/2012   Overweight (BMI 25.0-29.9) 06/01/2012     Past Medical History:  Diagnosis Date   Hyperlipidemia    Hypertension    Prostate cancer ([place])    adenocarcinoma gleason 7     Past Surgical History:  Procedure Laterality Date   COLONOSCOPY     ORIF DISPLACED RIGHT TWO-PART PROXIMAL HUMEROUS FX  03-05-2010   PROSTATE BIOPSY  02/24/2013  &   06-07-2013   PROSTATE SURGERY     RADIOACTIVE SEED IMPLANT N/A 09/08/2013   Procedure: RADIOACTIVE SEED IMPLANT;  Surgeon: Claybon Jabs, MD;  Location: [facility];  Service: Urology;  Laterality: N/A;   RIGHT SHOULDER SURGER FOR FX  2010  Family History  Problem Relation Age of Onset   Diabetes Mother    Cancer Maternal Uncle        prostate     Social History   Socioeconomic History   Marital status: Single    Spouse name: Not on file   Number of children: Not on file   Years of education: Not on file   Highest education level: Not on file  Occupational History   Occupation: RETIRED  Tobacco Use   Smoking status: Never Smoker   Smokeless  tobacco: Never Used  Substance and Sexual Activity   Alcohol use: Yes    Alcohol/week: 1.0 standard drink    Types: 1 Standard drinks or equivalent per week    Comment: RARE   Drug use: No   Sexual activity: Yes  Other Topics Concern   Not on file  Social History Narrative   Not on file   Social Determinants of Health   Financial Resource Strain:    Difficulty of Paying Living Expenses: Not on file  Food Insecurity:    Worried About [place] in the Last Year: Not on file   Ran Out of Food in the Last Year: Not on file  Transportation Needs:    Lack of Transportation (Medical): Not on file   Lack of Transportation (Non-Medical): Not on file  Physical Activity:    Days of Exercise per Week: Not on file   Minutes of Exercise per Session: Not on file  Stress:    Feeling of Stress : Not on file  Social Connections:    Frequency of Communication with Friends and Family: Not on file   Frequency of Social Gatherings with Friends and Family: Not on file   Attends Religious Services: Not on file   Active Member of Clubs or Organizations: Not on file   Attends Archivist Meetings: Not on file   Marital Status: Not on file  Intimate Partner Violence:    Fear of Current or Ex-Partner: Not on file   Emotionally Abused: Not on file   Physically Abused: Not on file   Sexually Abused: Not on file     No Known Allergies   Prior to Admission medications   Medication Sig Start Date End Date Taking? Authorizing Provider  aspirin 81 MG tablet Take 81 mg by mouth daily.   Yes [provider]  latanoprost (XALATAN) 0.005 % ophthalmic solution Place 2 drops into the left eye once. 04/05/20  Yes [provider]  Multiple Vitamin (MULTIVITAMIN) tablet Take 1 tablet by mouth daily.   Yes [provider]  pravastatin (PRAVACHOL) 40 MG tablet TAKE 1 TABLET(40 MG) BY MOUTH EVERY MORNING 04/18/20  Yes Wendie Agreste, MD    verapamil (CALAN-SR) 180 MG CR tablet Take 1 tablet (180 mg total) by mouth at bedtime. 04/18/20  Yes Wendie Agreste, MD  CIALIS 20 MG tablet  12/12/15   [provider]     Depression screen [facility] 2/9 08/15/2020 04/18/2020 03/14/2019 09/06/2018 03/04/2018  Decreased Interest 0 0 0 0 0  Down, Depressed, Hopeless 0 0 0 0 0  PHQ - 2 Score 0 0 0 0 0     Fall Risk  08/15/2020 04/18/2020 03/14/2019 09/06/2018 03/04/2018  Falls in the past year? 0 0 0 No No  Number falls in past yr: 0 - 0 - -  Injury with Fall? 0 - 0 - -  Follow up Falls evaluation completed;Education provided Falls evaluation  completed Falls evaluation completed - -      PHYSICAL EXAM: BP 132/74 Comment: not in clinic/taken from a previous visit   Ht 5\' 8"  (1.727 m)    Wt 183 lb (83 kg)    BMI 27.83 kg/m    Wt Readings from Last 3 Encounters:  08/15/20 183 lb (83 kg)  04/18/20 183 lb (83 kg)  11/02/19 185 lb (83.9 kg)       Education/Counseling provided regarding diet and exercise, prevention of chronic diseases, smoking/tobacco cessation, if applicable, and reviewed "Covered Medicare Preventive Services."

## 2020-08-15 NOTE — Patient Instructions (Signed)
Thank you for taking time to come for your Medicare Wellness Visit. I appreciate your ongoing commitment to your health goals. Please review the following plan we discussed and let me know if I can assist you in the future.  Alan Kennedy LPN  Preventive Care 69 Years and Older, Male Preventive care refers to lifestyle choices and visits with your health care provider that can promote health and wellness. This includes:  A yearly physical exam. This is also called an annual well check.  Regular dental and eye exams.  Immunizations.  Screening for certain conditions.  Healthy lifestyle choices, such as diet and exercise. What can I expect for my preventive care visit? Physical exam Your health care provider will check:  Height and weight. These may be used to calculate body mass index (BMI), which is a measurement that tells if you are at a healthy weight.  Heart rate and blood pressure.  Your skin for abnormal spots. Counseling Your health care provider may ask you questions about:  Alcohol, tobacco, and drug use.  Emotional well-being.  Home and relationship well-being.  Sexual activity.  Eating habits.  History of falls.  Memory and ability to understand (cognition).  Work and work Statistician. What immunizations do I need?  Influenza (flu) vaccine  This is recommended every year. Tetanus, diphtheria, and pertussis (Tdap) vaccine  You may need a Td booster every 10 years. Varicella (chickenpox) vaccine  You may need this vaccine if you have not already been vaccinated. Zoster (shingles) vaccine  You may need this after age 66. Pneumococcal conjugate (PCV13) vaccine  One dose is recommended after age 84. Pneumococcal polysaccharide (PPSV23) vaccine  One dose is recommended after age 88. Measles, mumps, and rubella (MMR) vaccine  You may need at least one dose of MMR if you were born in 1957 or later. You may also need a second dose. Meningococcal  conjugate (MenACWY) vaccine  You may need this if you have certain conditions. Hepatitis A vaccine  You may need this if you have certain conditions or if you travel or work in places where you may be exposed to hepatitis A. Hepatitis B vaccine  You may need this if you have certain conditions or if you travel or work in places where you may be exposed to hepatitis B. Haemophilus influenzae type b (Hib) vaccine  You may need this if you have certain conditions. You may receive vaccines as individual doses or as more than one vaccine together in one shot (combination vaccines). Talk with your health care provider about the risks and benefits of combination vaccines. What tests do I need? Blood tests  Lipid and cholesterol levels. These may be checked every 5 years, or more frequently depending on your overall health.  Hepatitis C test.  Hepatitis B test. Screening  Lung cancer screening. You may have this screening every year starting at age 24 if you have a 30-pack-year history of smoking and currently smoke or have quit within the past 15 years.  Colorectal cancer screening. All adults should have this screening starting at age 52 and continuing until age 61. Your health care provider may recommend screening at age 57 if you are at increased risk. You will have tests every 1-10 years, depending on your results and the type of screening test.  Prostate cancer screening. Recommendations will vary depending on your family history and other risks.  Diabetes screening. This is done by checking your blood sugar (glucose) after you have not eaten for  a while (fasting). You may have this done every 1-3 years.  Abdominal aortic aneurysm (AAA) screening. You may need this if you are a current or former smoker.  Sexually transmitted disease (STD) testing. Follow these instructions at home: Eating and drinking  Eat a diet that includes fresh fruits and vegetables, whole grains, lean  protein, and low-fat dairy products. Limit your intake of foods with high amounts of sugar, saturated fats, and salt.  Take vitamin and mineral supplements as recommended by your health care provider.  Do not drink alcohol if your health care provider tells you not to drink.  If you drink alcohol: ? Limit how much you have to 0-2 drinks a day. ? Be aware of how much alcohol is in your drink. In the [place] one drink equals one 12 oz bottle of beer (355 mL), one 5 oz glass of wine (148 mL), or one 1 oz glass of hard liquor (44 mL). Lifestyle  Take daily care of your teeth and gums.  Stay active. Exercise for at least 30 minutes on 5 or more days each week.  Do not use any products that contain nicotine or tobacco, such as cigarettes, e-cigarettes, and chewing tobacco. If you need help quitting, ask your health care provider.  If you are sexually active, practice safe sex. Use a condom or other form of protection to prevent STIs (sexually transmitted infections).  Talk with your health care provider about taking a low-dose aspirin or statin. What's next?  Visit your health care provider once a year for a well check visit.  Ask your health care provider how often you should have your eyes and teeth checked.  Stay up to date on all vaccines. This information is not intended to replace advice given to you by your health care provider. Make sure you discuss any questions you have with your health care provider. Document Revised: 11/11/2018 Document Reviewed: 11/11/2018 Elsevier Patient Education  2020 Elsevier Inc.  

## 2020-09-04 ENCOUNTER — Ambulatory Visit: Payer: Medicare Other | Attending: Internal Medicine

## 2020-09-04 DIAGNOSIS — Z23 Encounter for immunization: Secondary | ICD-10-CM

## 2020-09-04 NOTE — Progress Notes (Signed)
   Covid-19 Vaccination Clinic  Name:  Alan Shah    MRN: [id number] DOB: 15-Jun-1951  09/04/2020  Alan Shah was observed post Covid-19 immunization for 15 minutes without incident. He was provided with Vaccine Information Sheet and instruction to access the V-Safe system.   Alan Shah was instructed to call 911 with any severe reactions post vaccine: [name] Difficulty breathing  . Swelling of face and throat  . A fast heartbeat  . A bad rash all over body  . Dizziness and weakness

## 2020-09-18 DIAGNOSIS — Z23 Encounter for immunization: Secondary | ICD-10-CM | POA: Diagnosis not present

## 2020-10-19 ENCOUNTER — Encounter: Payer: Self-pay | Admitting: Family Medicine

## 2020-10-19 ENCOUNTER — Other Ambulatory Visit: Payer: Self-pay

## 2020-10-19 ENCOUNTER — Ambulatory Visit (INDEPENDENT_AMBULATORY_CARE_PROVIDER_SITE_OTHER): Payer: Medicare Other | Admitting: Family Medicine

## 2020-10-19 VITALS — BP 136/82 | HR 75 | Temp 98.3°F | Ht 68.0 in | Wt 182.0 lb

## 2020-10-19 DIAGNOSIS — I10 Essential (primary) hypertension: Secondary | ICD-10-CM | POA: Diagnosis not present

## 2020-10-19 DIAGNOSIS — E7800 Pure hypercholesterolemia, unspecified: Secondary | ICD-10-CM | POA: Diagnosis not present

## 2020-10-19 DIAGNOSIS — Z23 Encounter for immunization: Secondary | ICD-10-CM

## 2020-10-19 MED ORDER — PRAVASTATIN SODIUM 40 MG PO TABS: ORAL_TABLET | ORAL | 2 refills | Status: AC

## 2020-10-19 MED ORDER — VERAPAMIL HCL ER 180 MG PO TBCR: 180.0000 mg | EXTENDED_RELEASE_TABLET | Freq: Every day | ORAL | 2 refills | Status: AC

## 2020-10-19 NOTE — Progress Notes (Signed)
Subjective:  Patient ID: Alan Shah, male    DOB: Feb 06, 1951  Age: 69 y.o. MRN: [id number]  CC:  Chief Complaint  Patient presents with  . Follow-up    on kidney disease and hyperlipidemia. pt reports no issues with these conditions that he has noticed.    HPI Alan Shah presents for   Hypertension: With associated chronic kidney disease, followed by nephrology. Off HCTZ since July due to prior hypercalcemia, treated with verapamil 180 mg daily. Appointment with Dr. Moshe Cipro June 15.  Creatinine 3.52 at that time.  Minimal proteinuria consistent with hypertensive chronic kidney disease.  Ultrasound was negative, SPEP slightly positive.  No med changes, 65-month follow-up planned - 11/09/20.  Home readings: 126/74.  No new side effects with meds.   Had flu vaccine and covid booster.  BP Readings from Last 3 Encounters:  10/19/20 136/82  08/15/20 132/74  04/18/20 132/74   Lab Results  Component Value Date   CREATININE 3.02 ([place]) 04/19/2020    Hyperlipidemia: Pravastatin 40 mg daily. No new myalgias/side effects.  Lab Results  Component Value Date   CHOL 179 04/18/2020   HDL 79 04/18/2020   LDLCALC 90 04/18/2020   LDLDIRECT 70 07/06/2013   TRIG 47 04/18/2020   CHOLHDL 2.3 04/18/2020   Lab Results  Component Value Date   ALT 23 04/18/2020   AST 25 04/18/2020   ALKPHOS 59 04/18/2020   BILITOT 0.3 04/18/2020      History Patient Active Problem List   Diagnosis Date Noted  . Anemia in chronic kidney disease 02/13/2016  . Stage T1c Prostate Cancer, Gleasons 3+4, PSA 8.2  s/p Prostate Seed Implant 09/08/13 10/21/2013  . Hypercholesteremia 01/10/2013  . Unspecified vitamin D deficiency 01/10/2013  . Hypertrophy of prostate without urinary obstruction and other lower urinary tract symptoms (LUTS) 01/10/2013  . HTN (hypertension) 06/01/2012  . Overweight (BMI 25.0-29.9) 06/01/2012   Past Medical History:  Diagnosis Date  . Hyperlipidemia   . Hypertension     . Prostate cancer [facility])    adenocarcinoma gleason 7   Past Surgical History:  Procedure Laterality Date  . COLONOSCOPY    . ORIF DISPLACED RIGHT TWO-PART PROXIMAL HUMEROUS FX  03-05-2010  . PROSTATE BIOPSY  02/24/2013  &   06-07-2013  . PROSTATE SURGERY    . RADIOACTIVE SEED IMPLANT N/A 09/08/2013   Procedure: RADIOACTIVE SEED IMPLANT;  Surgeon: Claybon Jabs, MD;  Location: [facility];  Service: Urology;  Laterality: N/A;  . RIGHT SHOULDER SURGER FOR FX  2010   No Known Allergies Prior to Admission medications   Medication Sig Start Date End Date Taking? Authorizing Provider  aspirin 81 MG tablet Take 81 mg by mouth daily.   Yes [provider]  CIALIS 20 MG tablet  12/12/15  Yes [provider]  latanoprost (XALATAN) 0.005 % ophthalmic solution Place 2 drops into the left eye once. 04/05/20  Yes [provider]  Multiple Vitamin (MULTIVITAMIN) tablet Take 1 tablet by mouth daily.   Yes [provider]  pravastatin (PRAVACHOL) 40 MG tablet TAKE 1 TABLET(40 MG) BY MOUTH EVERY MORNING 04/18/20  Yes Wendie Agreste, MD  verapamil (CALAN-SR) 180 MG CR tablet Take 1 tablet (180 mg total) by mouth at bedtime. 04/18/20  Yes Wendie Agreste, MD   Social History   Socioeconomic History  . Marital status: Single    Spouse name: Not on file  . Number of children: Not on file  .  Years of education: Not on file  . Highest education level: Not on file  Occupational History  . Occupation: RETIRED  Tobacco Use  . Smoking status: Never Smoker  . Smokeless tobacco: Never Used  Substance and Sexual Activity  . Alcohol use: Yes    Alcohol/week: 1.0 standard drink    Types: 1 Standard drinks or equivalent per week    Comment: RARE  . Drug use: No  . Sexual activity: Yes  Other Topics Concern  . Not on file  Social History Narrative  . Not on file   Social Determinants of Health   Financial Resource Strain:   . Difficulty of Paying  Living Expenses: Not on file  Food Insecurity:   . Worried About [employer] fundraiser in the Last Year: Not on file  . Ran Out of Food in the Last Year: Not on file  Transportation Needs:   . Lack of Transportation (Medical): Not on file  . Lack of Transportation (Non-Medical): Not on file  Physical Activity:   . Days of Exercise per Week: Not on file  . Minutes of Exercise per Session: Not on file  Stress:   . Feeling of Stress : Not on file  Social Connections:   . Frequency of Communication with Friends and Family: Not on file  . Frequency of Social Gatherings with Friends and Family: Not on file  . Attends Religious Services: Not on file  . Active Member of Clubs or Organizations: Not on file  . Attends Archivist Meetings: Not on file  . Marital Status: Not on file  Intimate Partner Violence:   . Fear of Current or Ex-Partner: Not on file  . Emotionally Abused: Not on file  . Physically Abused: Not on file  . Sexually Abused: Not on file    Review of Systems  Constitutional: Negative for fatigue and unexpected weight change.  Eyes: Negative for visual disturbance.  Respiratory: Negative for cough, chest tightness and shortness of breath.   Cardiovascular: Negative for chest pain, palpitations and leg swelling.  Gastrointestinal: Negative for abdominal pain and blood in stool.  Neurological: Negative for dizziness, light-headedness and headaches.     Objective:   Vitals:   10/19/20 0759 10/19/20 0804  BP: (!) 157/79 136/82  Pulse: 75   Temp: 98.3 F (36.8 C)   TempSrc: Temporal   SpO2: 99%   Weight: 182 lb (82.6 kg)   Height: 5\' 8"  (1.727 m)      Physical Exam Vitals reviewed.  Constitutional:      Appearance: He is well-developed.  HENT:     Head: Normocephalic and atraumatic.  Eyes:     Pupils: Pupils are equal, round, and reactive to light.  Neck:     Vascular: No carotid bruit or JVD.  Cardiovascular:     Rate and Rhythm: Normal rate and  regular rhythm.     Heart sounds: Normal heart sounds. No murmur heard.   Pulmonary:     Effort: Pulmonary effort is normal.     Breath sounds: Normal breath sounds. No rales.  Skin:    General: Skin is warm and dry.  Neurological:     Mental Status: He is alert and oriented to person, place, and time.        Assessment & Plan:  Alan Shah is a 69 y.o. male . Essential hypertension - Plan: verapamil (CALAN-SR) 180 MG CR tablet  - Stable, tolerating current regimen. Medications refilled. Labs planned at  nephrology next month   Need for vaccination - Plan: Td : Tetanus/diphtheria >7yo Preservative  free  Hypercholesteremia - Plan: pravastatin (PRAVACHOL) 40 MG tablet  -  Stable, tolerating current regimen. Medications refilled.potential labs at nephrology visit, or next visit with me - stable prior.   Meds ordered this encounter  Medications  . pravastatin (PRAVACHOL) 40 MG tablet    Sig: TAKE 1 TABLET(40 MG) BY MOUTH EVERY MORNING    Dispense:  90 tablet    Refill:  2  . verapamil (CALAN-SR) 180 MG CR tablet    Sig: Take 1 tablet (180 mg total) by mouth at bedtime.    Dispense:  90 tablet    Refill:  2   Patient Instructions       If you have lab work done today you will be contacted with your lab results within the next 2 weeks.  If you have not heard from [place] then please contact us. The fastest way to get your results is to register for My Chart.   IF you received an x-ray today, you will receive an invoice from [facility]. Please contact [facility] at [phone] with questions or concerns regarding your invoice.   IF you received labwork today, you will receive an invoice from [facility]. Please contact [facility] at [phone] with questions or concerns regarding your invoice.   Our billing staff will not be able to assist you with questions regarding bills from these companies.  You will be contacted with the lab results as soon as they are  available. The fastest way to get your results is to activate your My Chart account. Instructions are located on the last page of this paperwork. If you have not heard from [place] regarding the results in 2 weeks, please contact this office.         Signed, [name], MD [facility]

## 2020-10-19 NOTE — Patient Instructions (Signed)
° ° ° °  If you have lab work done today you will be contacted with your lab results within the next 2 weeks.  If you have not heard from us then please contact us. The fastest way to get your results is to register for My Chart. ° ° °IF you received an x-ray today, you will receive an invoice from [facility]. Please contact [facility] at [phone] with questions or concerns regarding your invoice.  ° °IF you received labwork today, you will receive an invoice from [facility]. Please contact [facility] at [phone] with questions or concerns regarding your invoice.  ° °Our billing staff will not be able to assist you with questions regarding bills from these companies. ° °You will be contacted with the lab results as soon as they are available. The fastest way to get your results is to activate your My Chart account. Instructions are located on the last page of this paperwork. If you have not heard from us regarding the results in 2 weeks, please contact this office. °  ° ° ° °

## 2020-10-22 DIAGNOSIS — H401131 Primary open-angle glaucoma, bilateral, mild stage: Secondary | ICD-10-CM | POA: Diagnosis not present

## 2020-10-22 DIAGNOSIS — H26493 Other secondary cataract, bilateral: Secondary | ICD-10-CM | POA: Diagnosis not present

## 2020-10-22 DIAGNOSIS — H04123 Dry eye syndrome of bilateral lacrimal glands: Secondary | ICD-10-CM | POA: Diagnosis not present

## 2020-10-22 DIAGNOSIS — Z961 Presence of intraocular lens: Secondary | ICD-10-CM | POA: Diagnosis not present

## 2020-11-09 DIAGNOSIS — I129 Hypertensive chronic kidney disease with stage 1 through stage 4 chronic kidney disease, or unspecified chronic kidney disease: Secondary | ICD-10-CM | POA: Diagnosis not present

## 2020-11-09 DIAGNOSIS — D472 Monoclonal gammopathy: Secondary | ICD-10-CM | POA: Diagnosis not present

## 2020-11-09 DIAGNOSIS — N183 Chronic kidney disease, stage 3 unspecified: Secondary | ICD-10-CM | POA: Diagnosis not present

## 2020-11-09 DIAGNOSIS — D631 Anemia in chronic kidney disease: Secondary | ICD-10-CM | POA: Diagnosis not present

## 2020-11-09 DIAGNOSIS — C61 Malignant neoplasm of prostate: Secondary | ICD-10-CM | POA: Diagnosis not present

## 2020-11-09 DIAGNOSIS — N2581 Secondary hyperparathyroidism of renal origin: Secondary | ICD-10-CM | POA: Diagnosis not present

## 2020-11-09 DIAGNOSIS — N189 Chronic kidney disease, unspecified: Secondary | ICD-10-CM | POA: Diagnosis not present

## 2020-12-11 DIAGNOSIS — N401 Enlarged prostate with lower urinary tract symptoms: Secondary | ICD-10-CM | POA: Diagnosis not present

## 2020-12-31 DIAGNOSIS — N401 Enlarged prostate with lower urinary tract symptoms: Secondary | ICD-10-CM | POA: Diagnosis not present

## 2020-12-31 DIAGNOSIS — R351 Nocturia: Secondary | ICD-10-CM | POA: Diagnosis not present

## 2020-12-31 DIAGNOSIS — Z8546 Personal history of malignant neoplasm of prostate: Secondary | ICD-10-CM | POA: Diagnosis not present

## 2021-01-07 ENCOUNTER — Other Ambulatory Visit: Payer: Self-pay | Admitting: Family Medicine

## 2021-01-07 DIAGNOSIS — I10 Essential (primary) hypertension: Secondary | ICD-10-CM

## 2021-01-11 DIAGNOSIS — D631 Anemia in chronic kidney disease: Secondary | ICD-10-CM | POA: Diagnosis not present

## 2021-01-11 DIAGNOSIS — N2581 Secondary hyperparathyroidism of renal origin: Secondary | ICD-10-CM | POA: Diagnosis not present

## 2021-01-11 DIAGNOSIS — D472 Monoclonal gammopathy: Secondary | ICD-10-CM | POA: Diagnosis not present

## 2021-01-11 DIAGNOSIS — N189 Chronic kidney disease, unspecified: Secondary | ICD-10-CM | POA: Diagnosis not present

## 2021-01-11 DIAGNOSIS — N183 Chronic kidney disease, stage 3 unspecified: Secondary | ICD-10-CM | POA: Diagnosis not present

## 2021-01-11 DIAGNOSIS — C61 Malignant neoplasm of prostate: Secondary | ICD-10-CM | POA: Diagnosis not present

## 2021-01-11 DIAGNOSIS — I129 Hypertensive chronic kidney disease with stage 1 through stage 4 chronic kidney disease, or unspecified chronic kidney disease: Secondary | ICD-10-CM | POA: Diagnosis not present

## 2021-02-20 DIAGNOSIS — H04123 Dry eye syndrome of bilateral lacrimal glands: Secondary | ICD-10-CM | POA: Diagnosis not present

## 2021-02-20 DIAGNOSIS — H401131 Primary open-angle glaucoma, bilateral, mild stage: Secondary | ICD-10-CM | POA: Diagnosis not present

## 2021-02-20 DIAGNOSIS — H26493 Other secondary cataract, bilateral: Secondary | ICD-10-CM | POA: Diagnosis not present

## 2021-03-14 NOTE — Telephone Encounter (Signed)
Opened in error

## 2021-04-05 DIAGNOSIS — I129 Hypertensive chronic kidney disease with stage 1 through stage 4 chronic kidney disease, or unspecified chronic kidney disease: Secondary | ICD-10-CM | POA: Diagnosis not present

## 2021-04-05 DIAGNOSIS — N2581 Secondary hyperparathyroidism of renal origin: Secondary | ICD-10-CM | POA: Diagnosis not present

## 2021-04-05 DIAGNOSIS — D631 Anemia in chronic kidney disease: Secondary | ICD-10-CM | POA: Diagnosis not present

## 2021-04-05 DIAGNOSIS — N183 Chronic kidney disease, stage 3 unspecified: Secondary | ICD-10-CM | POA: Diagnosis not present

## 2021-04-05 DIAGNOSIS — C61 Malignant neoplasm of prostate: Secondary | ICD-10-CM | POA: Diagnosis not present

## 2021-04-05 DIAGNOSIS — N189 Chronic kidney disease, unspecified: Secondary | ICD-10-CM | POA: Diagnosis not present

## 2021-04-05 DIAGNOSIS — D472 Monoclonal gammopathy: Secondary | ICD-10-CM | POA: Diagnosis not present

## 2021-04-18 ENCOUNTER — Ambulatory Visit: Payer: Self-pay | Admitting: Family Medicine

## 2021-05-08 DIAGNOSIS — Z23 Encounter for immunization: Secondary | ICD-10-CM | POA: Diagnosis not present

## 2021-07-08 DIAGNOSIS — C61 Malignant neoplasm of prostate: Secondary | ICD-10-CM | POA: Diagnosis not present

## 2021-07-08 DIAGNOSIS — N189 Chronic kidney disease, unspecified: Secondary | ICD-10-CM | POA: Diagnosis not present

## 2021-07-08 DIAGNOSIS — I129 Hypertensive chronic kidney disease with stage 1 through stage 4 chronic kidney disease, or unspecified chronic kidney disease: Secondary | ICD-10-CM | POA: Diagnosis not present

## 2021-07-08 DIAGNOSIS — N183 Chronic kidney disease, stage 3 unspecified: Secondary | ICD-10-CM | POA: Diagnosis not present

## 2021-07-08 DIAGNOSIS — N2581 Secondary hyperparathyroidism of renal origin: Secondary | ICD-10-CM | POA: Diagnosis not present

## 2021-07-08 DIAGNOSIS — D472 Monoclonal gammopathy: Secondary | ICD-10-CM | POA: Diagnosis not present

## 2021-07-08 DIAGNOSIS — D631 Anemia in chronic kidney disease: Secondary | ICD-10-CM | POA: Diagnosis not present

## 2021-07-29 DIAGNOSIS — N184 Chronic kidney disease, stage 4 (severe): Secondary | ICD-10-CM | POA: Diagnosis not present

## 2021-07-29 DIAGNOSIS — E7800 Pure hypercholesterolemia, unspecified: Secondary | ICD-10-CM | POA: Diagnosis not present

## 2021-07-29 DIAGNOSIS — E559 Vitamin D deficiency, unspecified: Secondary | ICD-10-CM | POA: Diagnosis not present

## 2021-07-29 DIAGNOSIS — R7303 Prediabetes: Secondary | ICD-10-CM | POA: Diagnosis not present

## 2021-07-29 DIAGNOSIS — D631 Anemia in chronic kidney disease: Secondary | ICD-10-CM | POA: Diagnosis not present

## 2021-07-29 DIAGNOSIS — Z79899 Other long term (current) drug therapy: Secondary | ICD-10-CM | POA: Diagnosis not present

## 2021-07-29 DIAGNOSIS — I129 Hypertensive chronic kidney disease with stage 1 through stage 4 chronic kidney disease, or unspecified chronic kidney disease: Secondary | ICD-10-CM | POA: Diagnosis not present

## 2021-08-19 DIAGNOSIS — I129 Hypertensive chronic kidney disease with stage 1 through stage 4 chronic kidney disease, or unspecified chronic kidney disease: Secondary | ICD-10-CM | POA: Diagnosis not present

## 2021-08-19 DIAGNOSIS — N184 Chronic kidney disease, stage 4 (severe): Secondary | ICD-10-CM | POA: Diagnosis not present

## 2021-08-19 DIAGNOSIS — R944 Abnormal results of kidney function studies: Secondary | ICD-10-CM | POA: Diagnosis not present

## 2021-08-19 DIAGNOSIS — D649 Anemia, unspecified: Secondary | ICD-10-CM | POA: Diagnosis not present

## 2021-08-19 DIAGNOSIS — D472 Monoclonal gammopathy: Secondary | ICD-10-CM | POA: Diagnosis not present

## 2021-08-26 DIAGNOSIS — Z79899 Other long term (current) drug therapy: Secondary | ICD-10-CM | POA: Diagnosis not present

## 2021-08-26 DIAGNOSIS — E7800 Pure hypercholesterolemia, unspecified: Secondary | ICD-10-CM | POA: Diagnosis not present

## 2021-08-26 DIAGNOSIS — Z0000 Encounter for general adult medical examination without abnormal findings: Secondary | ICD-10-CM | POA: Diagnosis not present

## 2021-08-26 DIAGNOSIS — I10 Essential (primary) hypertension: Secondary | ICD-10-CM | POA: Diagnosis not present

## 2021-08-26 DIAGNOSIS — Z23 Encounter for immunization: Secondary | ICD-10-CM | POA: Diagnosis not present

## 2021-08-26 DIAGNOSIS — Z7189 Other specified counseling: Secondary | ICD-10-CM | POA: Diagnosis not present

## 2021-08-28 DIAGNOSIS — H26493 Other secondary cataract, bilateral: Secondary | ICD-10-CM | POA: Diagnosis not present

## 2021-08-28 DIAGNOSIS — Z961 Presence of intraocular lens: Secondary | ICD-10-CM | POA: Diagnosis not present

## 2021-08-28 DIAGNOSIS — H04123 Dry eye syndrome of bilateral lacrimal glands: Secondary | ICD-10-CM | POA: Diagnosis not present

## 2021-08-28 DIAGNOSIS — H401121 Primary open-angle glaucoma, left eye, mild stage: Secondary | ICD-10-CM | POA: Diagnosis not present

## 2021-09-04 DIAGNOSIS — Z23 Encounter for immunization: Secondary | ICD-10-CM | POA: Diagnosis not present

## 2021-10-10 DIAGNOSIS — N2581 Secondary hyperparathyroidism of renal origin: Secondary | ICD-10-CM | POA: Diagnosis not present

## 2021-10-10 DIAGNOSIS — N183 Chronic kidney disease, stage 3 unspecified: Secondary | ICD-10-CM | POA: Diagnosis not present

## 2021-10-10 DIAGNOSIS — C61 Malignant neoplasm of prostate: Secondary | ICD-10-CM | POA: Diagnosis not present

## 2021-10-10 DIAGNOSIS — D631 Anemia in chronic kidney disease: Secondary | ICD-10-CM | POA: Diagnosis not present

## 2021-10-10 DIAGNOSIS — I129 Hypertensive chronic kidney disease with stage 1 through stage 4 chronic kidney disease, or unspecified chronic kidney disease: Secondary | ICD-10-CM | POA: Diagnosis not present

## 2021-10-10 DIAGNOSIS — N189 Chronic kidney disease, unspecified: Secondary | ICD-10-CM | POA: Diagnosis not present

## 2021-10-10 DIAGNOSIS — D472 Monoclonal gammopathy: Secondary | ICD-10-CM | POA: Diagnosis not present

## 2022-05-25 ENCOUNTER — Other Ambulatory Visit: Payer: Self-pay

## 2022-05-25 DIAGNOSIS — N189 Chronic kidney disease, unspecified: Secondary | ICD-10-CM

## 2022-06-24 ENCOUNTER — Encounter: Payer: Self-pay | Admitting: Vascular Surgery

## 2022-06-24 ENCOUNTER — Ambulatory Visit (HOSPITAL_COMMUNITY)
Admission: RE | Admit: 2022-06-24 | Discharge: 2022-06-24 | Disposition: A | Discharge status: Home / Self Care | Payer: Medicare Other | Source: Ambulatory Visit | Attending: Vascular Surgery | Admitting: Vascular Surgery

## 2022-06-24 ENCOUNTER — Ambulatory Visit (INDEPENDENT_AMBULATORY_CARE_PROVIDER_SITE_OTHER): Payer: Medicare Other | Admitting: Vascular Surgery

## 2022-06-24 ENCOUNTER — Ambulatory Visit (INDEPENDENT_AMBULATORY_CARE_PROVIDER_SITE_OTHER)
Admission: RE | Admit: 2022-06-24 | Discharge: 2022-06-24 | Disposition: A | Discharge status: Home / Self Care | Payer: Medicare Other | Source: Ambulatory Visit | Attending: Vascular Surgery | Admitting: Vascular Surgery

## 2022-06-24 DIAGNOSIS — N189 Chronic kidney disease, unspecified: Secondary | ICD-10-CM

## 2022-06-24 DIAGNOSIS — N184 Chronic kidney disease, stage 4 (severe): Secondary | ICD-10-CM

## 2022-06-24 DIAGNOSIS — I129 Hypertensive chronic kidney disease with stage 1 through stage 4 chronic kidney disease, or unspecified chronic kidney disease: Secondary | ICD-10-CM | POA: Diagnosis not present

## 2022-06-24 NOTE — Progress Notes (Signed)
Patient name: Alan Shah MRN: [id number] DOB: [birth year] Sex: male  REASON FOR CONSULT: Permanent hemodialysis access  HPI: Alan Shah is a 71 y.o. male, with hypertension, hyperlipidemia, prostate cancer, stage IV CKD that presents for evaluation of permanent hemodialysis access.  Patient has not started dialysis at this time.  He is right-handed.  He has no chest wall implants.  He is under the impression that he has stage III CKD.  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  Past Medical History:  Diagnosis Date   Hyperlipidemia    Hypertension    Prostate cancer [facility])    adenocarcinoma gleason 7    Past Surgical History:  Procedure Laterality Date   COLONOSCOPY     ORIF DISPLACED RIGHT TWO-PART PROXIMAL HUMEROUS FX  03-05-2010   PROSTATE BIOPSY  02/24/2013  &   06-07-2013   PROSTATE SURGERY     RADIOACTIVE SEED IMPLANT N/A 09/08/2013   Procedure: RADIOACTIVE SEED IMPLANT;  Surgeon: Claybon Jabs, MD;  Location: [facility];  Service: Urology;  Laterality: N/A;   RIGHT SHOULDER SURGER FOR FX  2010    Family History  Problem Relation Age of Onset   Diabetes Mother    Cancer Maternal Uncle        prostate    SOCIAL HISTORY: Social History   Socioeconomic History   Marital status: Single    Spouse name: Not on file   Number of children: Not on file   Years of education: Not on file   Highest education level: Not on file  Occupational History   Occupation: RETIRED  Tobacco Use   Smoking status: Never   Smokeless tobacco: Never  Substance and Sexual Activity   Alcohol use: Yes    Alcohol/week: 1.0 standard drink of alcohol    Types: 1 Standard drinks or equivalent per week    Comment: RARE   Drug use: No   Sexual activity: Yes  Other Topics Concern   Not on file  Social History Narrative   Not on file   Social Determinants of Health   Financial Resource Strain: Not on file  Food Insecurity: Not on file  Transportation Needs: Not on file   Physical Activity: Not on file  Stress: Not on file  Social Connections: Not on file  Intimate Partner Violence: Not on file    No Known Allergies  Current Outpatient Medications  Medication Sig Dispense Refill   aspirin 81 MG tablet Take 81 mg by mouth daily.     latanoprost (XALATAN) 0.005 % ophthalmic solution Place 2 drops into the left eye once.     Multiple Vitamin (MULTIVITAMIN) tablet Take 1 tablet by mouth daily.     pravastatin (PRAVACHOL) 40 MG tablet TAKE 1 TABLET(40 MG) BY MOUTH EVERY MORNING 90 tablet 2   verapamil (CALAN-SR) 180 MG CR tablet Take 1 tablet (180 mg total) by mouth at bedtime. 90 tablet 2   CIALIS 20 MG tablet   1   No current facility-administered medications for this visit.    REVIEW OF SYSTEMS:  '[X]'$  denotes positive finding, '[ ]'$  denotes negative finding Cardiac  Comments:  Chest pain or chest pressure:    Shortness of breath upon exertion:    Short of breath when lying flat:    Irregular heart rhythm:        Vascular    Pain in calf, thigh, or hip brought on by ambulation:    Pain in feet at  night that wakes you up from your sleep:     Blood clot in your veins:    Leg swelling:         Pulmonary    Oxygen at home:    Productive cough:     Wheezing:         Neurologic    Sudden weakness in arms or legs:     Sudden numbness in arms or legs:     Sudden onset of difficulty speaking or slurred speech:    Temporary loss of vision in one eye:     Problems with dizziness:         Gastrointestinal    Blood in stool:     Vomited blood:         Genitourinary    Burning when urinating:     Blood in urine:        Psychiatric    Major depression:         Hematologic    Bleeding problems:    Problems with blood clotting too easily:        Skin    Rashes or ulcers:        Constitutional    Fever or chills:      PHYSICAL EXAM: Vitals:   06/24/22 0932  BP: (!) 155/75  Pulse: 61  Resp: 16  Temp: 97.7 F (36.5 C)  TempSrc:  Temporal  SpO2: 98%  Weight: 166 lb (75.3 kg)  Height: '5\' 10"'$  (1.778 m)    GENERAL: The patient is a well-nourished male, in no acute distress. The vital signs are documented above. CARDIAC: There is a regular rate and rhythm.  VASCULAR:  Palpable radial brachial pulses bilateral upper extremities PULMONARY: No respiratory distress. ABDOMEN: Soft and non-tender. MUSCULOSKELETAL: There are no major deformities or cyanosis. NEUROLOGIC: No focal weakness or paresthesias are detected. SKIN: There are no ulcers or rashes noted. PSYCHIATRIC: The patient has a normal affect.  DATA:   Triphasic waveforms both upper extremities on arterial duplex.  Vein mapping shows usable cephalic and basilic veins in both upper extremities.  Assessment/Plan:  71 year old male with stage IV CKD secondary to hypertension that presents for evaluation of hemodialysis access.  I discussed plans for placement in the nondominant arm which would be his left arm.  Discussed that he has a nice cephalic and basilic vein in the left arm.  Risks benefits discussed including bleeding, infection, failure to mature, steal syndrome.  Ultimately he wants to further discuss with his nephrologist Dr. Moshe Cipro next month.  He was under the impression that he still has stage III CKD.  The nephrology notes do indicate that he has had progression to stage IV with GFR less than 20.  He will call our office to schedule when he feels ready.  Discussed taking up to 3 months to mature once access is placed.   Marty Heck, MD [facility] [place] Office: [phone]

## 2022-09-03 ENCOUNTER — Ambulatory Visit (INDEPENDENT_AMBULATORY_CARE_PROVIDER_SITE_OTHER): Payer: Medicare Other | Admitting: Podiatry

## 2022-09-03 DIAGNOSIS — B351 Tinea unguium: Secondary | ICD-10-CM

## 2022-09-03 DIAGNOSIS — M79674 Pain in right toe(s): Secondary | ICD-10-CM | POA: Diagnosis not present

## 2022-09-03 DIAGNOSIS — M79675 Pain in left toe(s): Secondary | ICD-10-CM

## 2022-09-03 NOTE — Progress Notes (Signed)
   Chief Complaint  Patient presents with   Nail Problem    Bilateral Great toe nails are discolored possible fungus, the patient states that his toe nails have been this way for several months.    SUBJECTIVE Patient presents to office today complaining of elongated, thickened nails that cause pain while ambulating in shoes.  Patient is unable to trim their own nails.  Patient states that over the past several months he has noticed discoloration to his bilateral great toenails.  Patient is here for further evaluation and treatment.  Past Medical History:  Diagnosis Date   Hyperlipidemia    Hypertension    Prostate cancer ([place])    adenocarcinoma gleason 7    No Known Allergies   OBJECTIVE General Patient is awake, alert, and oriented x 3 and in no acute distress. Derm Skin is dry and supple bilateral. Negative open lesions or macerations. Remaining integument unremarkable. Nails are tender, long, thickened and dystrophic with subungual debris, consistent with onychomycosis, 1-5 bilateral. No signs of infection noted.  After debridement of the right hallux nail plate which was very thick and discolored, there was an underlying nailbed wound likely caused by pressure from the overlying nail plate.  It appeared healthy and granular.  No clinical indication of infection.  Only superficial subungual debris noted Vasc  DP and PT pedal pulses palpable bilaterally. Temperature gradient within normal limits.  Neuro Epicritic and protective threshold sensation grossly intact bilaterally.  Musculoskeletal Exam No symptomatic pedal deformities noted bilateral. Muscular strength within normal limits.  ASSESSMENT 1.  Pain due to onychomycosis of toenails both  PLAN OF CARE 1. Patient evaluated today.  2. Instructed to maintain good pedal hygiene and foot care.  3. Mechanical debridement of nails 1-5 bilaterally performed using a nail nipper. Filed with dremel without incident.  4.  Recommend  Silvadene cream daily to the underlying nailbed wound right hallux  5.  Return to clinic in 3 mos.    Edrick Kins, DPM [facility]  Dr. Edrick Kins, DPM    [address], [place] [address]                Office [phone]  Fax [phone]

## 2022-11-18 ENCOUNTER — Other Ambulatory Visit: Payer: Self-pay

## 2022-11-18 DIAGNOSIS — I129 Hypertensive chronic kidney disease with stage 1 through stage 4 chronic kidney disease, or unspecified chronic kidney disease: Secondary | ICD-10-CM

## 2022-12-26 ENCOUNTER — Other Ambulatory Visit: Payer: Self-pay

## 2022-12-26 ENCOUNTER — Encounter (HOSPITAL_COMMUNITY): Payer: Self-pay | Admitting: Vascular Surgery

## 2022-12-26 NOTE — Progress Notes (Signed)
Spoke with pt for pre-op call. Pt has hx of HTN and CKD stage 4. Pt denies Diabetes or cardiac history.   Shower instructions given to pt and he voiced understanding.

## 2022-12-29 ENCOUNTER — Other Ambulatory Visit: Payer: Self-pay

## 2022-12-29 ENCOUNTER — Encounter (HOSPITAL_COMMUNITY)
Admission: RE | Disposition: A | Discharge status: Home / Self Care | Payer: Self-pay | Source: Ambulatory Visit | Attending: Vascular Surgery

## 2022-12-29 ENCOUNTER — Ambulatory Visit (HOSPITAL_BASED_OUTPATIENT_CLINIC_OR_DEPARTMENT_OTHER): Payer: Medicare Other | Admitting: Certified Registered"

## 2022-12-29 ENCOUNTER — Ambulatory Visit (HOSPITAL_COMMUNITY)
Admission: RE | Admit: 2022-12-29 | Discharge: 2022-12-29 | Disposition: A | Discharge status: Home / Self Care | Payer: Medicare Other | Source: Ambulatory Visit | Attending: Vascular Surgery | Admitting: Vascular Surgery

## 2022-12-29 ENCOUNTER — Encounter (HOSPITAL_COMMUNITY): Payer: Self-pay | Admitting: Vascular Surgery

## 2022-12-29 ENCOUNTER — Ambulatory Visit (HOSPITAL_COMMUNITY): Payer: Medicare Other | Admitting: Certified Registered"

## 2022-12-29 DIAGNOSIS — I129 Hypertensive chronic kidney disease with stage 1 through stage 4 chronic kidney disease, or unspecified chronic kidney disease: Secondary | ICD-10-CM | POA: Insufficient documentation

## 2022-12-29 DIAGNOSIS — E785 Hyperlipidemia, unspecified: Secondary | ICD-10-CM | POA: Insufficient documentation

## 2022-12-29 DIAGNOSIS — Z8042 Family history of malignant neoplasm of prostate: Secondary | ICD-10-CM | POA: Insufficient documentation

## 2022-12-29 DIAGNOSIS — C61 Malignant neoplasm of prostate: Secondary | ICD-10-CM | POA: Diagnosis not present

## 2022-12-29 DIAGNOSIS — N184 Chronic kidney disease, stage 4 (severe): Secondary | ICD-10-CM | POA: Diagnosis present

## 2022-12-29 DIAGNOSIS — D631 Anemia in chronic kidney disease: Secondary | ICD-10-CM

## 2022-12-29 HISTORY — DX: Chronic kidney disease, unspecified: N18.9

## 2022-12-29 HISTORY — PX: AV FISTULA PLACEMENT: SHX1204

## 2022-12-29 LAB — POCT I-STAT, CHEM 8
BUN: 101 mg/dL — ABNORMAL HIGH (ref 8–23)
Calcium, Ion: 1.17 mmol/L (ref 1.15–1.40)
Chloride: 102 mmol/L (ref 98–111)
Creatinine, Ser: 6.8 mg/dL — ABNORMAL HIGH (ref 0.61–1.24)
Glucose, Bld: 103 mg/dL — ABNORMAL HIGH (ref 70–99)
HCT: 32 % — ABNORMAL LOW (ref 39.0–52.0)
Hemoglobin: 10.9 g/dL — ABNORMAL LOW (ref 13.0–17.0)
Potassium: 3.6 mmol/L (ref 3.5–5.1)
Sodium: 139 mmol/L (ref 135–145)
TCO2: 22 mmol/L (ref 22–32)

## 2022-12-29 SURGERY — ARTERIOVENOUS (AV) FISTULA CREATION
Anesthesia: Monitor Anesthesia Care | Site: Arm Upper | Laterality: Left

## 2022-12-29 MED ORDER — MIDAZOLAM HCL 2 MG/2ML IJ SOLN: 1.0000 mg | Freq: Once | INTRAMUSCULAR | Status: AC

## 2022-12-29 MED ORDER — HEPARIN SODIUM (PORCINE) 1000 UNIT/ML IJ SOLN
INTRAMUSCULAR | Status: AC
  Filled 2022-12-29: qty 10

## 2022-12-29 MED ORDER — ONDANSETRON HCL 4 MG/2ML IJ SOLN: 4.0000 mg | Freq: Once | INTRAMUSCULAR | Status: DC | PRN

## 2022-12-29 MED ORDER — LIDOCAINE HCL 1 % IJ SOLN
INTRAMUSCULAR | Status: DC | PRN
  Administered 2022-12-29: 5 mL

## 2022-12-29 MED ORDER — HEPARIN 6000 UNIT IRRIGATION SOLUTION
Status: DC | PRN
  Administered 2022-12-29: 1

## 2022-12-29 MED ORDER — SODIUM CHLORIDE 0.9 % IV SOLN: INTRAVENOUS | Status: DC

## 2022-12-29 MED ORDER — FENTANYL CITRATE (PF) 100 MCG/2ML IJ SOLN: 50.0000 ug | Freq: Once | INTRAMUSCULAR | Status: AC

## 2022-12-29 MED ORDER — ACETAMINOPHEN 500 MG PO TABS
1000.0000 mg | ORAL_TABLET | Freq: Once | ORAL | Status: AC
  Administered 2022-12-29: 1000 mg via ORAL
  Filled 2022-12-29: qty 2

## 2022-12-29 MED ORDER — HYDROMORPHONE HCL 1 MG/ML IJ SOLN: 0.2500 mg | INTRAMUSCULAR | Status: DC | PRN

## 2022-12-29 MED ORDER — CHLORHEXIDINE GLUCONATE 4 % EX LIQD: 60.0000 mL | Freq: Once | CUTANEOUS | Status: DC

## 2022-12-29 MED ORDER — OXYCODONE HCL 5 MG PO TABS: 5.0000 mg | ORAL_TABLET | Freq: Once | ORAL | Status: DC | PRN

## 2022-12-29 MED ORDER — FENTANYL CITRATE (PF) 250 MCG/5ML IJ SOLN
INTRAMUSCULAR | Status: DC | PRN
  Administered 2022-12-29: 50 ug via INTRAVENOUS

## 2022-12-29 MED ORDER — LACTATED RINGERS IV SOLN: INTRAVENOUS | Status: DC

## 2022-12-29 MED ORDER — LIDOCAINE-EPINEPHRINE (PF) 1.5 %-1:200000 IJ SOLN
INTRAMUSCULAR | Status: DC | PRN
  Administered 2022-12-29: 20 mL via PERINEURAL

## 2022-12-29 MED ORDER — LIDOCAINE HCL (PF) 1 % IJ SOLN
INTRAMUSCULAR | Status: AC
  Filled 2022-12-29: qty 30

## 2022-12-29 MED ORDER — LIDOCAINE-EPINEPHRINE (PF) 1.5 %-1:200000 IJ SOLN: INTRAMUSCULAR | Status: DC | PRN

## 2022-12-29 MED ORDER — ORAL CARE MOUTH RINSE: 15.0000 mL | Freq: Once | OROMUCOSAL | Status: AC

## 2022-12-29 MED ORDER — HYDROCODONE-ACETAMINOPHEN 5-325 MG PO TABS: 1.0000 | ORAL_TABLET | Freq: Four times a day (QID) | ORAL | 0 refills | Status: DC | PRN

## 2022-12-29 MED ORDER — PHENYLEPHRINE HCL-NACL 20-0.9 MG/250ML-% IV SOLN
INTRAVENOUS | Status: DC | PRN
  Administered 2022-12-29: 20 ug/min via INTRAVENOUS

## 2022-12-29 MED ORDER — MIDAZOLAM HCL 2 MG/2ML IJ SOLN
INTRAMUSCULAR | Status: AC
  Administered 2022-12-29: 1 mg via INTRAVENOUS
  Filled 2022-12-29: qty 2

## 2022-12-29 MED ORDER — HEPARIN 6000 UNIT IRRIGATION SOLUTION
Status: AC
  Filled 2022-12-29: qty 500

## 2022-12-29 MED ORDER — FENTANYL CITRATE (PF) 100 MCG/2ML IJ SOLN
INTRAMUSCULAR | Status: AC
  Administered 2022-12-29: 50 ug via INTRAVENOUS
  Filled 2022-12-29: qty 2

## 2022-12-29 MED ORDER — CHLORHEXIDINE GLUCONATE 0.12 % MT SOLN
15.0000 mL | Freq: Once | OROMUCOSAL | Status: AC
  Administered 2022-12-29: 15 mL via OROMUCOSAL
  Filled 2022-12-29: qty 15

## 2022-12-29 MED ORDER — PROPOFOL 500 MG/50ML IV EMUL
INTRAVENOUS | Status: DC | PRN
  Administered 2022-12-29: 100 ug/kg/min via INTRAVENOUS

## 2022-12-29 MED ORDER — HEPARIN SODIUM (PORCINE) 1000 UNIT/ML IJ SOLN
INTRAMUSCULAR | Status: DC | PRN
  Administered 2022-12-29: 3000 [IU] via INTRAVENOUS

## 2022-12-29 MED ORDER — PROPOFOL 10 MG/ML IV BOLUS
INTRAVENOUS | Status: DC | PRN
  Administered 2022-12-29: 50 mg via INTRAVENOUS
  Administered 2022-12-29: 25 mg via INTRAVENOUS

## 2022-12-29 MED ORDER — 0.9 % SODIUM CHLORIDE (POUR BTL) OPTIME
TOPICAL | Status: DC | PRN
  Administered 2022-12-29: 1000 mL

## 2022-12-29 MED ORDER — FENTANYL CITRATE (PF) 250 MCG/5ML IJ SOLN
INTRAMUSCULAR | Status: AC
  Filled 2022-12-29: qty 5

## 2022-12-29 MED ORDER — STERILE WATER FOR IRRIGATION IR SOLN
Status: DC | PRN
  Administered 2022-12-29: 1000 mL

## 2022-12-29 MED ORDER — OXYCODONE HCL 5 MG/5ML PO SOLN: 5.0000 mg | Freq: Once | ORAL | Status: DC | PRN

## 2022-12-29 MED ORDER — CEFAZOLIN SODIUM-DEXTROSE 2-4 GM/100ML-% IV SOLN
2.0000 g | INTRAVENOUS | Status: AC
  Administered 2022-12-29: 2 g via INTRAVENOUS
  Filled 2022-12-29: qty 100

## 2022-12-29 SURGICAL SUPPLY — 31 items
ADH SKN CLS APL DERMABOND .7 (GAUZE/BANDAGES/DRESSINGS) ×1
ARMBAND PINK RESTRICT EXTREMIT (MISCELLANEOUS) ×4 IMPLANT
BAG COUNTER SPONGE SURGICOUNT (BAG) ×2 IMPLANT
BAG SPNG CNTER NS LX DISP (BAG) ×1
BLADE CLIPPER SURG (BLADE) ×2 IMPLANT
CANISTER SUCT 3000ML PPV (MISCELLANEOUS) ×2 IMPLANT
CLIP VESOCCLUDE MED 6/CT (CLIP) ×2 IMPLANT
CLIP VESOCCLUDE SM WIDE 6/CT (CLIP) ×2 IMPLANT
COVER PROBE W GEL 5X96 (DRAPES) ×2 IMPLANT
DERMABOND ADVANCED .7 DNX12 (GAUZE/BANDAGES/DRESSINGS) ×2 IMPLANT
ELECT REM PT RETURN 9FT ADLT (ELECTROSURGICAL) ×1
ELECTRODE REM PT RTRN 9FT ADLT (ELECTROSURGICAL) ×2 IMPLANT
GLOVE BIO SURGEON STRL SZ7.5 (GLOVE) ×2 IMPLANT
GLOVE BIOGEL PI IND STRL 8 (GLOVE) ×2 IMPLANT
GOWN STRL REUS W/ TWL LRG LVL3 (GOWN DISPOSABLE) ×4 IMPLANT
GOWN STRL REUS W/ TWL XL LVL3 (GOWN DISPOSABLE) ×4 IMPLANT
GOWN STRL REUS W/TWL LRG LVL3 (GOWN DISPOSABLE) ×2
GOWN STRL REUS W/TWL XL LVL3 (GOWN DISPOSABLE) ×2
KIT BASIN OR (CUSTOM PROCEDURE TRAY) ×2 IMPLANT
KIT TURNOVER KIT B (KITS) ×2 IMPLANT
NS IRRIG 1000ML POUR BTL (IV SOLUTION) ×2 IMPLANT
PACK CV ACCESS (CUSTOM PROCEDURE TRAY) ×2 IMPLANT
PAD ARMBOARD 7.5X6 YLW CONV (MISCELLANEOUS) ×4 IMPLANT
SLING ARM FOAM STRAP LRG (SOFTGOODS) IMPLANT
SUT MNCRL AB 4-0 PS2 18 (SUTURE) ×2 IMPLANT
SUT PROLENE 6 0 BV (SUTURE) ×2 IMPLANT
SUT VIC AB 3-0 SH 27 (SUTURE) ×1
SUT VIC AB 3-0 SH 27X BRD (SUTURE) ×2 IMPLANT
TOWEL GREEN STERILE (TOWEL DISPOSABLE) ×2 IMPLANT
UNDERPAD 30X36 HEAVY ABSORB (UNDERPADS AND DIAPERS) ×2 IMPLANT
WATER STERILE IRR 1000ML POUR (IV SOLUTION) ×2 IMPLANT

## 2022-12-29 NOTE — H&P (Signed)
Patient name: Alan Shah MRN: [id number]        DOB: 1951/09/17          Sex: male   REASON FOR CONSULT: Permanent hemodialysis access   HPI: Alan Shah is a 72 y.o. male, with hypertension, hyperlipidemia, prostate cancer, stage IV CKD that presents for evaluation of permanent hemodialysis access.  Patient has not started dialysis at this time.  He is right-handed.  He has no chest wall implants.  He is under the impression that he has stage III CKD.  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       Past Medical History:  Diagnosis Date   Hyperlipidemia     Hypertension     Prostate cancer [facility])      adenocarcinoma gleason 7           Past Surgical History:  Procedure Laterality Date   COLONOSCOPY       ORIF DISPLACED RIGHT TWO-PART PROXIMAL HUMEROUS FX   03-05-2010   PROSTATE BIOPSY   02/24/2013  &   06-07-2013   PROSTATE SURGERY       RADIOACTIVE SEED IMPLANT N/A 09/08/2013    Procedure: RADIOACTIVE SEED IMPLANT;  Surgeon: Claybon Jabs, MD;  Location: [facility];  Service: Urology;  Laterality: N/A;   RIGHT SHOULDER SURGER FOR FX   2010           Family History  Problem Relation Age of Onset   Diabetes Mother     Cancer Maternal Uncle          prostate      SOCIAL HISTORY: Social History         Socioeconomic History   Marital status: Single      Spouse name: Not on file   Number of children: Not on file   Years of education: Not on file   Highest education level: Not on file  Occupational History   Occupation: RETIRED  Tobacco Use   Smoking status: Never   Smokeless tobacco: Never  Substance and Sexual Activity   Alcohol use: Yes      Alcohol/week: 1.0 standard drink of alcohol      Types: 1 Standard drinks or equivalent per week      Comment: RARE   Drug use: No   Sexual activity: Yes  Other Topics Concern   Not on file  Social History Narrative   Not on file    Social Determinants of Health    Financial Resource Strain: Not on file   Food Insecurity: Not on file  Transportation Needs: Not on file  Physical Activity: Not on file  Stress: Not on file  Social Connections: Not on file  Intimate Partner Violence: Not on file      No Known Allergies         Current Outpatient Medications  Medication Sig Dispense Refill   aspirin 81 MG tablet Take 81 mg by mouth daily.       latanoprost (XALATAN) 0.005 % ophthalmic solution Place 2 drops into the left eye once.       Multiple Vitamin (MULTIVITAMIN) tablet Take 1 tablet by mouth daily.       pravastatin (PRAVACHOL) 40 MG tablet TAKE 1 TABLET(40 MG) BY MOUTH EVERY MORNING 90 tablet 2   verapamil (CALAN-SR) 180 MG CR tablet Take 1 tablet (180 mg total) by mouth at bedtime. 90 tablet 2   CIALIS 20 MG tablet  1    No current facility-administered medications for this visit.      REVIEW OF SYSTEMS:  '[X]'$  denotes positive finding, '[ ]'$  denotes negative finding Cardiac   Comments:  Chest pain or chest pressure:      Shortness of breath upon exertion:      Short of breath when lying flat:      Irregular heart rhythm:             Vascular      Pain in calf, thigh, or hip brought on by ambulation:      Pain in feet at night that wakes you up from your sleep:       Blood clot in your veins:      Leg swelling:              Pulmonary      Oxygen at home:      Productive cough:       Wheezing:              Neurologic      Sudden weakness in arms or legs:       Sudden numbness in arms or legs:       Sudden onset of difficulty speaking or slurred speech:      Temporary loss of vision in one eye:       Problems with dizziness:              Gastrointestinal      Blood in stool:       Vomited blood:              Genitourinary      Burning when urinating:       Blood in urine:             Psychiatric      Major depression:              Hematologic      Bleeding problems:      Problems with blood clotting too easily:             Skin      Rashes or ulcers:              Constitutional      Fever or chills:          PHYSICAL EXAM:    Vitals:    06/24/22 0932  BP: (!) 155/75  Pulse: 61  Resp: 16  Temp: 97.7 F (36.5 C)  TempSrc: Temporal  SpO2: 98%  Weight: 166 lb (75.3 kg)  Height: '5\' 10"'$  (1.778 m)      GENERAL: The patient is a well-nourished male, in no acute distress. The vital signs are documented above. CARDIAC: There is a regular rate and rhythm.  VASCULAR:  Palpable radial brachial pulses bilateral upper extremities PULMONARY: No respiratory distress. ABDOMEN: Soft and non-tender. MUSCULOSKELETAL: There are no major deformities or cyanosis. NEUROLOGIC: No focal weakness or paresthesias are detected. SKIN: There are no ulcers or rashes noted. PSYCHIATRIC: The patient has a normal affect.   DATA:    Triphasic waveforms both upper extremities on arterial duplex.   Vein mapping shows usable cephalic and basilic veins in both upper extremities.   Assessment/Plan:   72 year old male with stage IV CKD secondary to hypertension that presents for evaluation of hemodialysis access.  I discussed plans for placement in the nondominant arm which would be his left arm.  Discussed that he has a nice  cephalic and basilic vein in the left arm.  Risks benefits discussed including bleeding, infection, failure to mature, steal syndrome.      Marty Heck, MD [facility] [place] Office: [phone]

## 2022-12-29 NOTE — Discharge Instructions (Signed)
   [facility] [place] ° °Discharge Instructions ° °AV Fistula or Graft Surgery for Dialysis Access ° °Please refer to the following instructions for your post-procedure care. Your surgeon or physician assistant will discuss any changes with you. ° °Activity ° °You may drive the day following your surgery, if you are comfortable and no longer taking prescription pain medication. Resume full activity as the soreness in your incision resolves. ° °Bathing/Showering ° °You may shower after you go home. Keep your incision dry for 48 hours. Do not soak in a bathtub, hot tub, or swim until the incision heals completely. You may not shower if you have a hemodialysis catheter. ° °Incision Care ° °Clean your incision with mild soap and water after 48 hours. Pat the area dry with a clean towel. You do not need a bandage unless otherwise instructed. Do not apply any ointments or creams to your incision. You may have skin glue on your incision. Do not peel it off. It will come off on its own in about one week. Your arm may swell a bit after surgery. To reduce swelling use pillows to elevate your arm so it is above your heart. Your doctor will tell you if you need to lightly wrap your arm with an ACE bandage. ° °Diet ° °Resume your normal diet. There are not special food restrictions following this procedure. In order to heal from your surgery, it is CRITICAL to get adequate nutrition. Your body requires vitamins, minerals, and protein. Vegetables are the best source of vitamins and minerals. Vegetables also provide the perfect balance of protein. Processed food has little nutritional value, so try to avoid this. ° °Medications ° °Resume taking all of your medications. If your incision is causing pain, you may take over-the counter pain relievers such as acetaminophen (Tylenol). If you were prescribed a stronger pain medication, please be aware these medications can cause nausea and constipation. Prevent  nausea by taking the medication with a snack or meal. Avoid constipation by drinking plenty of fluids and eating foods with high amount of fiber, such as fruits, vegetables, and grains. Do not take Tylenol if you are taking prescription pain medications. ° ° ° ° °Follow up °Your surgeon may want to see you in the office following your access surgery. If so, this will be arranged at the time of your surgery. ° °Please call us immediately for any of the following conditions: ° °Increased pain, redness, drainage (pus) from your incision site °Fever of 101 degrees or higher °Severe or worsening pain at your incision site °Hand pain or numbness. ° °Reduce your risk of vascular disease: ° °Stop smoking. If you would like help, call [facility] at 1-800-QUIT-NOW (1-800-784-8669) or [facility] at 336-586-4000 ° °Manage your cholesterol °Maintain a desired weight °Control your diabetes °Keep your blood pressure down ° °Dialysis ° °It will take several weeks to several months for your new dialysis access to be ready for use. Your surgeon will determine when it is OK to use it. Your nephrologist will continue to direct your dialysis. You can continue to use your Permcath until your new access is ready for use. ° °If you have any questions, please call the office at [phone]. ° °

## 2022-12-29 NOTE — Anesthesia Preprocedure Evaluation (Addendum)
Anesthesia Evaluation  Patient identified by MRN, date of birth, ID band Patient awake    Reviewed: Allergy & Precautions, H&P , NPO status , Patient's Chart, lab work & pertinent test results  Airway Mallampati: III  TM Distance: >3 FB Neck ROM: Full    Dental  (+) Teeth Intact, Dental Advisory Given   Pulmonary neg pulmonary ROS   Pulmonary exam normal breath sounds clear to auscultation       Cardiovascular METS: 7 - 9 Mets hypertension (1161/82 in preop, per pt normally 130s SBP), Pt. on medications Normal cardiovascular exam Rhythm:Regular Rate:Normal  Exercises on treadmill 2h/d   Neuro/Psych negative neurological ROS  negative psych ROS   GI/Hepatic negative GI ROS, Neg liver ROS,,,  Endo/Other  negative endocrine ROS    Renal/GU ESRFRenal diseaseCKD 4, not yet on HD  K 3.6 this AM  negative genitourinary   Musculoskeletal negative musculoskeletal ROS (+)    Abdominal   Peds negative pediatric ROS (+)  Hematology  (+) Blood dyscrasia, anemia Hb 10.9   Anesthesia Other Findings   Reproductive/Obstetrics negative OB ROS                             Anesthesia Physical Anesthesia Plan  ASA: 3  Anesthesia Plan: MAC and Regional   Post-op Pain Management: Tylenol PO (pre-op)*   Induction:   PONV Risk Score and Plan: 2 and Propofol infusion and TIVA  Airway Management Planned: Natural Airway and Simple Face Mask  Additional Equipment: None  Intra-op Plan:   Post-operative Plan:   Informed Consent: I have reviewed the patients History and Physical, chart, labs and discussed the procedure including the risks, benefits and alternatives for the proposed anesthesia with the patient or authorized representative who has indicated his/her understanding and acceptance.       Plan Discussed with: CRNA  Anesthesia Plan Comments:        Anesthesia Quick Evaluation

## 2022-12-29 NOTE — Anesthesia Postprocedure Evaluation (Signed)
Anesthesia Post Note  Patient: Alan Shah  Procedure(s) Performed: LEFT ARM BRACHIOCEPHALIC ARTERIOVENOUS (AV) FISTULA CREATION (Left: Arm Upper)     Patient location during evaluation: PACU Anesthesia Type: Regional and MAC Level of consciousness: awake and alert Pain management: pain level controlled Vital Signs Assessment: post-procedure vital signs reviewed and stable Respiratory status: spontaneous breathing, nonlabored ventilation and respiratory function stable Cardiovascular status: blood pressure returned to baseline and stable Postop Assessment: no apparent nausea or vomiting Anesthetic complications: no   No notable events documented.  Last Vitals:  Vitals:   12/29/22 1050 12/29/22 1115  BP:  (!) 155/78  Pulse:  (!) 55  Resp:  19  Temp: (!) 36.1 C (!) 36.1 C  SpO2:  98%    Last Pain:  Vitals:   12/29/22 1115  TempSrc:   PainSc: 0-No pain                 Pervis Hocking

## 2022-12-29 NOTE — Op Note (Signed)
OPERATIVE NOTE  DATE: December 29, 2022  PROCEDURE: left brachiocephalic arteriovenous fistula placement  PRE-OPERATIVE DIAGNOSIS: chronic kidney disease stage 4  POST-OPERATIVE DIAGNOSIS: same as above   SURGEON: Marty Heck, MD  ASSISTANT(S): Arlee Muslim, PA  ANESTHESIA: regional  ESTIMATED BLOOD LOSS: Minimal  FINDING(S): 1.  Cephalic vein: 4 mm, acceptable 2.  Brachial artery: 6 mm, atherosclerotic disease evident 3.  Venous outflow: palpable thrill  4.  Radial flow: palpable radial pulse  SPECIMEN(S):  none  INDICATIONS:   Alan Shah is a 72 y.o. male who presents with chronic kidney disease stage 4 and the need for permanent hemodialysis accecss.  The patient is scheduled for left arm arteriovenous fistula placement.  The patient is aware the risks include but are not limited to: bleeding, infection, steal syndrome, nerve damage, ischemic monomelic neuropathy, failure to mature, and need for additional procedures.  The patient is aware of the risks of the procedure and elects to proceed forward.  An assistant was needed for exposure and expedite the case and for the anastomosis.   DESCRIPTION: After full informed written consent was obtained from the patient, the patient was brought back to the operating room and placed supine upon the operating table.  Prior to induction, the patient received IV antibiotics.   After obtaining adequate anesthesia, the patient was then prepped and draped in the standard fashion for a left arm access procedure.  I turned my attention first to identifying the patient's cephalic vein using ultrasound and this appeared to be of much better caliber at the antecubital fossa and smaller at the wrist..  Using SonoSite guidance, the brachial artery and cephalic vein were marked at the antecubital fossa.       I made a transverse incision at the level of the antecubitum and dissected through the subcutaneous tissue and fascia to gain  exposure of the brachial artery.  This was noted to be 6 mm in diameter externally.  This was dissected out proximally and distally and controlled with vessel loops .  I then dissected out the cephalic vein.  This was noted to be 4 mm in diameter externally.  The distal segment of the vein was ligated with a  2-0 silk, and the vein was transected.  The proximal segment was interrogated with serial dilators.  The vein accepted up to a 5 mm dilator without any difficulty.  I then instilled the heparinized saline into the vein and clamped it.  At this point, I reset my exposure of the brachial artery.  The patient was given 3,000 units IV heparin.  I then placed the artery under tension proximally and distally.  I made an arteriotomy with a #11 blade, and then I extended the arteriotomy with a Potts scissor.  I injected heparinized saline proximal and distal to this arteriotomy.  The vein was then sewn to the artery in an end-to-side configuration with a running stitch of 6-0 Prolene with the help of my assistant.  Prior to completing this anastomosis, I allowed the vein and artery to backbleed.  There was no evidence of clot from any vessels.  I completed the anastomosis in the usual fashion and then released all vessel loops and clamps.    There was a palpable thrill in the venous outflow, and there was a palpable radial pulse.  At this point, I irrigated out the surgical wound.  There was no further active bleeding.  The subcutaneous tissue was reapproximated with a running stitch  of 3-0 Vicryl.  The skin was then reapproximated with a running subcuticular stitch of 4-0 Monocryl.  The skin was then cleaned, dried, and reinforced with Dermabond.  The patient tolerated this procedure well.   COMPLICATIONS: None  CONDITION: Stable  Marty Heck, MD [facility] Office: [place]   12/29/2022, 10:38 AM

## 2022-12-29 NOTE — Transfer of Care (Signed)
Immediate Anesthesia Transfer of Care Note  Patient: Alan Shah  Procedure(s) Performed: LEFT ARM BRACHIOCEPHALIC ARTERIOVENOUS (AV) FISTULA CREATION (Left: Arm Upper)  Patient Location: PACU  Anesthesia Type:MAC combined with regional for post-op pain  Level of Consciousness: sedated  Airway & Oxygen Therapy: Patient Spontanous Breathing  Post-op Assessment: Report given to RN and Post -op Vital signs reviewed and stable  Post vital signs: Reviewed and stable  Last Vitals:  Vitals Value Taken Time  BP 139/59   Temp 98   Pulse 55 12/29/22 1050  Resp 13 12/29/22 1050  SpO2 99 % 12/29/22 1050  Vitals shown include unvalidated device data.  Last Pain:  Vitals:   12/29/22 0650  TempSrc:   PainSc: 0-No pain         Complications: No notable events documented.

## 2022-12-29 NOTE — Anesthesia Procedure Notes (Addendum)
Anesthesia Regional Block: Supraclavicular block   Pre-Anesthetic Checklist: , timeout performed,  Correct Patient, Correct Site, Correct Laterality,  Correct Procedure, Correct Position, site marked,  Risks and benefits discussed,  Surgical consent,  Pre-op evaluation,  At surgeon's request and post-op pain management  Laterality: Left  Prep: Maximum Sterile Barrier Precautions used, chloraprep       Needles:  Injection technique: Single-shot  Needle Type: Echogenic Stimulator Needle     Needle Length: 9cm  Needle Gauge: 22     Additional Needles:   Procedures:,,,, ultrasound used (permanent image in chart),,    Narrative:  Start time: 12/29/2022 8:20 AM End time: 12/29/2022 8:30 AM Injection made incrementally with aspirations every 5 mL.  Performed by: Personally  Anesthesiologist: Pervis Hocking, DO  Additional Notes: Monitors applied. No increased pain on injection. No increased resistance to injection. Injection made in 5cc increments. Good needle visualization. Patient tolerated procedure well.

## 2022-12-30 ENCOUNTER — Encounter (HOSPITAL_COMMUNITY): Payer: Self-pay | Admitting: Vascular Surgery

## 2023-01-01 ENCOUNTER — Telehealth: Payer: Self-pay | Admitting: Physician Assistant

## 2023-01-01 NOTE — Telephone Encounter (Signed)
-----  Message from Dagoberto Ligas, PA-C sent at 12/29/2022 10:42 AM EST -----  4-6 week fistula duplex with PA.  PO L brachiocephalic fistula Thanks

## 2023-01-30 ENCOUNTER — Other Ambulatory Visit: Payer: Self-pay | Admitting: *Deleted

## 2023-01-30 DIAGNOSIS — I129 Hypertensive chronic kidney disease with stage 1 through stage 4 chronic kidney disease, or unspecified chronic kidney disease: Secondary | ICD-10-CM

## 2023-02-05 ENCOUNTER — Other Ambulatory Visit (HOSPITAL_COMMUNITY): Payer: Self-pay | Admitting: *Deleted

## 2023-02-09 ENCOUNTER — Ambulatory Visit (HOSPITAL_COMMUNITY)
Admission: RE | Admit: 2023-02-09 | Discharge: 2023-02-09 | Disposition: A | Discharge status: Home / Self Care | Payer: Medicare Other | Source: Ambulatory Visit | Attending: Nephrology | Admitting: Nephrology

## 2023-02-09 DIAGNOSIS — I129 Hypertensive chronic kidney disease with stage 1 through stage 4 chronic kidney disease, or unspecified chronic kidney disease: Secondary | ICD-10-CM | POA: Insufficient documentation

## 2023-02-09 DIAGNOSIS — N184 Chronic kidney disease, stage 4 (severe): Secondary | ICD-10-CM | POA: Insufficient documentation

## 2023-02-09 MED ORDER — SODIUM CHLORIDE 0.9 % IV SOLN
510.0000 mg | INTRAVENOUS | Status: DC
  Administered 2023-02-09: 510 mg via INTRAVENOUS
  Filled 2023-02-09: qty 510

## 2023-02-10 ENCOUNTER — Encounter: Payer: Self-pay | Admitting: Physician Assistant

## 2023-02-10 ENCOUNTER — Ambulatory Visit (INDEPENDENT_AMBULATORY_CARE_PROVIDER_SITE_OTHER)
Admission: RE | Admit: 2023-02-10 | Discharge: 2023-02-10 | Disposition: A | Discharge status: Home / Self Care | Payer: Medicare Other | Source: Ambulatory Visit | Attending: Vascular Surgery | Admitting: Vascular Surgery

## 2023-02-10 ENCOUNTER — Ambulatory Visit (INDEPENDENT_AMBULATORY_CARE_PROVIDER_SITE_OTHER): Payer: Self-pay | Admitting: Physician Assistant

## 2023-02-10 VITALS — BP 153/74 | HR 56 | Temp 97.7°F | Ht 70.0 in | Wt 168.0 lb

## 2023-02-10 DIAGNOSIS — N184 Chronic kidney disease, stage 4 (severe): Secondary | ICD-10-CM

## 2023-02-10 DIAGNOSIS — I129 Hypertensive chronic kidney disease with stage 1 through stage 4 chronic kidney disease, or unspecified chronic kidney disease: Secondary | ICD-10-CM

## 2023-02-10 NOTE — Progress Notes (Signed)
POST OPERATIVE OFFICE NOTE    CC:  F/u for surgery  HPI:  This is a 72 y.o. male who is s/p left BC fistula creation on 12/29/22 by Dr. Carlis Abbott.    Pt returns today for follow up.  Pt states he has no pain, loss of motor or loss of sensation.     No Known Allergies  Current Outpatient Medications  Medication Sig Dispense Refill   aspirin EC 81 MG tablet Take 81 mg by mouth in the morning.     hydrochlorothiazide (HYDRODIURIL) 25 MG tablet Take 25 mg by mouth in the morning.     HYDROcodone-acetaminophen (NORCO) 5-325 MG tablet Take 1 tablet by mouth every 6 (six) hours as needed for moderate pain. 10 tablet 0   latanoprost (XALATAN) 0.005 % ophthalmic solution Place 1 drop into the left eye at bedtime.     Multiple Vitamin (MULTIVITAMIN) tablet Take 1 tablet by mouth in the morning.     pravastatin (PRAVACHOL) 40 MG tablet TAKE 1 TABLET(40 MG) BY MOUTH EVERY MORNING 90 tablet 2   verapamil (CALAN-SR) 180 MG CR tablet Take 1 tablet (180 mg total) by mouth at bedtime. 90 tablet 2   No current facility-administered medications for this visit.     ROS:  See HPI  Physical Exam:     Findings:  +--------------------+----------+-----------------+--------+  AVF                PSV (cm/s)Flow Vol (mL/min)Comments  +--------------------+----------+-----------------+--------+  Native artery inflow   187          2387                 +--------------------+----------+-----------------+--------+  AVF Anastomosis        466                               +--------------------+----------+-----------------+--------+     +------------+-------------+-------------+-----------+---------------------  ----+  OUTFLOW VEIN PSV (cm/s)  Diameter (cm)Depth (cm)         Describe            +------------+-------------+-------------+-----------+---------------------  ----+  Shoulder        147         0.85        0.60                                 +------------+-------------+-------------+-----------+---------------------  ----+  Prox UA          148         0.53        0.33                                +------------+-------------+-------------+-----------+---------------------  ----+  Mid UA      260 / 3530.55 0.50 / 0.48 0.24 / 0.29competing branch and  0.33                                                         cm 90 cm/s           +------------+-------------+-------------+-----------+---------------------  ----+  Dist UA  283         0.56        0.31                                +------------+-------------+-------------+-----------+---------------------  ----+  AC Fossa         428         0.39        0.16                                +------------+-------------+-------------+-----------+---------------------  ----+        Summary:  Patent arteriovenous fistula.   Incision:  well healed Extremities:  N/V/M inact Palpable thrill throughout the fistula with surface visibility of the fistula.     Assessment/Plan:  This is a 72 y.o. male who is CKD s/p: Left BC AV fistula is maturing well.  There is excellent flow and minimal depth.  He has CKD and is not currently on HD.   He can perform daily activities as tolerates.  I encouraged active use of the left UE to assist with maturation of the fistula.  As of May 1st the fistula may be accessed if needed for HD.  F/U PRN     Roxy Horseman PA-C [facility] [phone]   Clinic MD:  Carlis Abbott

## 2023-02-17 ENCOUNTER — Encounter (HOSPITAL_COMMUNITY)
Admission: RE | Admit: 2023-02-17 | Discharge: 2023-02-17 | Disposition: A | Discharge status: Home / Self Care | Payer: Medicare Other | Source: Ambulatory Visit | Attending: Nephrology | Admitting: Nephrology

## 2023-02-17 DIAGNOSIS — D631 Anemia in chronic kidney disease: Secondary | ICD-10-CM | POA: Insufficient documentation

## 2023-02-17 DIAGNOSIS — N184 Chronic kidney disease, stage 4 (severe): Secondary | ICD-10-CM | POA: Diagnosis not present

## 2023-02-17 MED ORDER — SODIUM CHLORIDE 0.9 % IV SOLN
510.0000 mg | INTRAVENOUS | Status: DC
  Administered 2023-02-17: 510 mg via INTRAVENOUS
  Filled 2023-02-17: qty 510

## 2024-01-27 ENCOUNTER — Other Ambulatory Visit (HOSPITAL_COMMUNITY): Payer: Self-pay | Admitting: *Deleted

## 2024-01-28 ENCOUNTER — Ambulatory Visit (HOSPITAL_COMMUNITY)
Admission: RE | Admit: 2024-01-28 | Discharge: 2024-01-28 | Disposition: A | Discharge status: Home / Self Care | Payer: Medicare Other | Source: Ambulatory Visit | Attending: Nephrology | Admitting: Nephrology

## 2024-01-28 DIAGNOSIS — N189 Chronic kidney disease, unspecified: Secondary | ICD-10-CM | POA: Insufficient documentation

## 2024-01-28 DIAGNOSIS — D631 Anemia in chronic kidney disease: Secondary | ICD-10-CM | POA: Diagnosis present

## 2024-01-28 MED ORDER — SODIUM CHLORIDE 0.9 % IV SOLN
510.0000 mg | INTRAVENOUS | Status: DC
  Administered 2024-01-28: 510 mg via INTRAVENOUS
  Filled 2024-01-28: qty 510

## 2024-02-04 ENCOUNTER — Ambulatory Visit (HOSPITAL_COMMUNITY)
Admission: RE | Admit: 2024-02-04 | Discharge: 2024-02-04 | Disposition: A | Discharge status: Home / Self Care | Payer: TRICARE For Life (TFL) | Source: Ambulatory Visit | Attending: Nephrology | Admitting: Nephrology

## 2024-02-04 DIAGNOSIS — N189 Chronic kidney disease, unspecified: Secondary | ICD-10-CM | POA: Diagnosis present

## 2024-02-04 DIAGNOSIS — D631 Anemia in chronic kidney disease: Secondary | ICD-10-CM | POA: Diagnosis not present

## 2024-02-04 MED ORDER — SODIUM CHLORIDE 0.9 % IV SOLN
510.0000 mg | INTRAVENOUS | Status: DC
  Administered 2024-02-04: 510 mg via INTRAVENOUS
  Filled 2024-02-04: qty 510

## 2024-10-19 ENCOUNTER — Inpatient Hospital Stay (HOSPITAL_BASED_OUTPATIENT_CLINIC_OR_DEPARTMENT_OTHER): Admitting: Hematology & Oncology

## 2024-10-19 ENCOUNTER — Encounter: Payer: Self-pay | Admitting: Hematology & Oncology

## 2024-10-19 ENCOUNTER — Inpatient Hospital Stay: Attending: Hematology & Oncology

## 2024-10-19 VITALS — BP 106/69 | HR 64 | Temp 98.3°F | Resp 18 | Ht 69.0 in | Wt 151.8 lb

## 2024-10-19 DIAGNOSIS — I129 Hypertensive chronic kidney disease with stage 1 through stage 4 chronic kidney disease, or unspecified chronic kidney disease: Secondary | ICD-10-CM | POA: Insufficient documentation

## 2024-10-19 DIAGNOSIS — N184 Chronic kidney disease, stage 4 (severe): Secondary | ICD-10-CM | POA: Diagnosis not present

## 2024-10-19 DIAGNOSIS — D472 Monoclonal gammopathy: Secondary | ICD-10-CM | POA: Diagnosis not present

## 2024-10-19 DIAGNOSIS — Z8546 Personal history of malignant neoplasm of prostate: Secondary | ICD-10-CM | POA: Insufficient documentation

## 2024-10-19 DIAGNOSIS — Z992 Dependence on renal dialysis: Secondary | ICD-10-CM | POA: Insufficient documentation

## 2024-10-19 DIAGNOSIS — D631 Anemia in chronic kidney disease: Secondary | ICD-10-CM

## 2024-10-19 LAB — CBC WITH DIFFERENTIAL (CANCER CENTER ONLY)
Abs Immature Granulocytes: 0.01 K/uL (ref 0.00–0.07)
Basophils Absolute: 0 K/uL (ref 0.0–0.1)
Basophils Relative: 1 %
Eosinophils Absolute: 0 K/uL (ref 0.0–0.5)
Eosinophils Relative: 0 %
HCT: 35 % — ABNORMAL LOW (ref 39.0–52.0)
Hemoglobin: 11.3 g/dL — ABNORMAL LOW (ref 13.0–17.0)
Immature Granulocytes: 0 %
Lymphocytes Relative: 31 %
Lymphs Abs: 1 K/uL (ref 0.7–4.0)
MCH: 29.5 pg (ref 26.0–34.0)
MCHC: 32.3 g/dL (ref 30.0–36.0)
MCV: 91.4 fL (ref 80.0–100.0)
Monocytes Absolute: 0.5 K/uL (ref 0.1–1.0)
Monocytes Relative: 16 %
Neutro Abs: 1.8 K/uL (ref 1.7–7.7)
Neutrophils Relative %: 52 %
Platelet Count: 187 K/uL (ref 150–400)
RBC: 3.83 MIL/uL — ABNORMAL LOW (ref 4.22–5.81)
RDW: 15.6 % — ABNORMAL HIGH (ref 11.5–15.5)
WBC Count: 3.4 K/uL — ABNORMAL LOW (ref 4.0–10.5)
nRBC: 0 % (ref 0.0–0.2)

## 2024-10-19 LAB — CMP (CANCER CENTER ONLY)
ALT: 22 U/L (ref 0–44)
AST: 26 U/L (ref 15–41)
Albumin: 4.7 g/dL (ref 3.5–5.0)
Alkaline Phosphatase: 61 U/L (ref 38–126)
Anion gap: 18 — ABNORMAL HIGH (ref 5–15)
BUN: 56 mg/dL — ABNORMAL HIGH (ref 8–23)
CO2: 28 mmol/L (ref 22–32)
Calcium: 8.9 mg/dL (ref 8.9–10.3)
Chloride: 97 mmol/L — ABNORMAL LOW (ref 98–111)
Creatinine: 8.93 mg/dL (ref 0.61–1.24)
GFR, Estimated: 6 mL/min — ABNORMAL LOW (ref >60)
Glucose, Bld: 101 mg/dL — ABNORMAL HIGH (ref 70–99)
Potassium: 4.6 mmol/L (ref 3.5–5.1)
Sodium: 143 mmol/L (ref 135–145)
Total Bilirubin: 0.4 mg/dL (ref 0.0–1.2)
Total Protein: 7.4 g/dL (ref 6.5–8.1)

## 2024-10-19 LAB — LACTATE DEHYDROGENASE: LDH: 148 U/L (ref 105–235)

## 2024-10-19 NOTE — Progress Notes (Signed)
 Referral MD  Reason for Referral: Possible MGUS .  Chief Complaint  Patient presents with   New Patient (Initial Visit)  : I was told he needed a bone marrow biopsy.  HPI: Alan Shah is a very nice 73 year old African-American male.  He was in the [employer] for 28 years.  I thanked him for service in the [employer].  He was in the [employer]  He recently was found to have renal failure secondary to hypertension.  He is on dialysis.  He has an AV fistula in the left arm.  He is felt to be a candidate for transplant.  Apparently, he was found to have a MGUS.  Unfortunately, I do not have any labs at all on him.  He was told that he had to come to see us  so he could have a bone marrow biopsy so he would qualify for transplant.  He does have a history of prostate cancer.  This was back in late 2014.  He had seed implants.  He has had no problems since.  He has had no issues with infections.  He has had no weight loss or weight gain.  He is eating well.  He is still urinating.  He is having no change in bowel habits.  He has had no cough or shortness of breath.  He has not had COVID.  He has had no bleeding.  He has had no leg swelling.  He has had no rashes.  There has been no visual changes.  He has not smoked.  He does not have alcoholic beverages.  He has never had surgery outside of the for the prostate and for the AV fistula.  I must say that he does look quite good.  He says he works out 2 hours a day.  Currently, I would say that his performance status is probably ECOG 1.    Past Medical History:  Diagnosis Date   Chronic kidney disease    Stage 4 CKD   Hyperlipidemia    Hypertension    Prostate cancer (HCC)    adenocarcinoma gleason 7  :   Past Surgical History:  Procedure Laterality Date   AV FISTULA PLACEMENT Left 12/29/2022   Procedure: LEFT ARM BRACHIOCEPHALIC ARTERIOVENOUS (AV) FISTULA CREATION;  Surgeon: Gretta Lonni PARAS, MD;  Location: [facility] OR;  Service: Vascular;   Laterality: Left;  NERVE BLOCK   COLONOSCOPY     ORIF DISPLACED RIGHT TWO-PART PROXIMAL HUMEROUS FX  03-05-2010   PROSTATE BIOPSY  02/24/2013  &   06-07-2013   PROSTATE SURGERY     RADIOACTIVE SEED IMPLANT N/A 09/08/2013   Procedure: RADIOACTIVE SEED IMPLANT;  Surgeon: Mark C Ottelin, MD;  Location: [facility];  Service: Urology;  Laterality: N/A;   RIGHT SHOULDER SURGER FOR FX  2010  :   Current Outpatient Medications:    aspirin EC 81 MG tablet, Take 81 mg by mouth in the morning., Disp: , Rfl:    hydrochlorothiazide  (HYDRODIURIL ) 25 MG tablet, Take 25 mg by mouth in the morning., Disp: , Rfl:    latanoprost (XALATAN) 0.005 % ophthalmic solution, Place 1 drop into the left eye at bedtime., Disp: , Rfl:    Multiple Vitamin (MULTIVITAMIN) tablet, Take 1 tablet by mouth in the morning., Disp: , Rfl:    pravastatin  (PRAVACHOL ) 40 MG tablet, TAKE 1 TABLET(40 MG) BY MOUTH EVERY MORNING, Disp: 90 tablet, Rfl: 2   verapamil  (CALAN -SR) 180 MG CR tablet, Take 1 tablet (180 mg total)  by mouth at bedtime., Disp: 90 tablet, Rfl: 2:  :  No Known Allergies:   Family History  Problem Relation Age of Onset   Diabetes Mother    Cancer Maternal Uncle        prostate  :   Social History   Socioeconomic History   Marital status: Single    Spouse name: Not on file   Number of children: Not on file   Years of education: Not on file   Highest education level: Not on file  Occupational History   Occupation: RETIRED  Tobacco Use   Smoking status: Never   Smokeless tobacco: Never  Vaping Use   Vaping status: Never Used  Substance and Sexual Activity   Alcohol use: Not Currently    Alcohol/week: 1.0 standard drink of alcohol    Types: 1 Standard drinks or equivalent per week   Drug use: No   Sexual activity: Yes  Other Topics Concern   Not on file  Social History Narrative   Not on file   Social Drivers of Health   Financial Resource Strain: Low Risk  (08/20/2022)    Received from [facility]   Overall Financial Resource Strain (CARDIA)  Food Insecurity: No Food Insecurity (10/19/2024)   Hunger Vital Sign    Worried About Running Out of Food in the Last Year: Never true    Ran Out of Food in the Last Year: Never true  Transportation Needs: No Transportation Needs (10/19/2024)   PRAPARE - Administrator, Civil Service (Medical): No    Lack of Transportation (Non-Medical): No  Physical Activity: Sufficiently Active (08/20/2022)   Received from [facility]   Exercise Vital Sign  Stress: No Stress Concern Present (08/20/2022)   Received from [facility] of Occupational Health - Occupational Stress Questionnaire  Social Connections: Moderately Integrated (08/20/2022)   Received from [facility]   Social Connection and Isolation Panel  Intimate Partner Violence: Not At Risk (10/19/2024)   Humiliation, Afraid, Rape, and Kick questionnaire    Fear of Current or Ex-Partner: No    Emotionally Abused: No    Physically Abused: No    Sexually Abused: No  :  Review of Systems  Constitutional: Negative.   HENT: Negative.    Eyes: Negative.   Respiratory: Negative.    Cardiovascular: Negative.   Gastrointestinal: Negative.   Genitourinary: Negative.   Musculoskeletal: Negative.   Skin: Negative.   Neurological: Negative.   Endo/Heme/Allergies: Negative.   Psychiatric/Behavioral: Negative.       Exam:  Vital signs show temperature 98.3.  Pulse 64.  Blood pressure 106/69.  Weight is 151 pounds.  @IPVITALS @ Physical Exam Vitals reviewed.  HENT:     Head: Normocephalic and atraumatic.  Eyes:     Pupils: Pupils are equal, round, and reactive to light.  Cardiovascular:     Rate and Rhythm: Normal rate and regular rhythm.     Heart sounds: Normal heart sounds.  Pulmonary:     Effort: Pulmonary effort is normal.     Breath sounds: Normal breath sounds.  Abdominal:     General: Bowel sounds are normal.      Palpations: Abdomen is soft.  Musculoskeletal:        General: No tenderness or deformity. Normal range of motion.     Cervical back: Normal range of motion.  Lymphadenopathy:     Cervical: No cervical adenopathy.  Skin:    General: Skin is warm and  dry.     Findings: No erythema or rash.  Neurological:     Mental Status: He is alert and oriented to person, place, and time.  Psychiatric:        Behavior: Behavior normal.        Thought Content: Thought content normal.        Judgment: Judgment normal.     Recent Labs    10/19/24 1358  WBC 3.4*  HGB 11.3*  HCT 35.0*  PLT 187   No results for input(s): NA, K, CL, CO2, GLUCOSE, BUN, CREATININE, CALCIUM in the last 72 hours.  Blood smear review: Normochromic and normocytic population of red blood cells.  I see no rouleaux formation.  There is no nucleated red blood cells.  I see no schistocytes or spherocytes.  There is no target cells.  White blood cells appear normal in morphology and maturation.  There is no immature myeloid or lymphoid forms.  Platelets are adequate in number and size.  Pathology: None    Assessment and Plan: Alan Shah is a very nice 73 year old African-American male.  He has renal failure from hypertension.  I have to assume that there must have been a MGUS that was found.  Again, I have no records of labs that were done previously on him.  However, if a bone marrow biopsy is what he needs, we will make sure he gets this done.  He served our country probably in [employer] so we will make sure that we serve him.  I will set up the bone marrow biopsy for him.  We will get this done in about 3 or 4 weeks.  I also want to get a 24-hour urine on him.  I know he does not make a lot of urine but hopefully he will make enough that we will have a adequate sample.  He is very nice.  I really enjoyed talking to Alan Shah.  He is very eloquent.  Hopefully, the bone marrow will be negative.  I cannot  imagine that he has an underlying plasma cell disorder.

## 2024-10-20 LAB — KAPPA/LAMBDA LIGHT CHAINS
Kappa free light chain: 308.7 mg/L — ABNORMAL HIGH (ref 3.3–19.4)
Kappa, lambda light chain ratio: 2.87 — ABNORMAL HIGH (ref 0.26–1.65)
Lambda free light chains: 107.6 mg/L — ABNORMAL HIGH (ref 5.7–26.3)

## 2024-10-20 LAB — IGG, IGA, IGM
IgA: 143 mg/dL (ref 61–437)
IgG (Immunoglobin G), Serum: 1242 mg/dL (ref 603–1613)
IgM (Immunoglobulin M), Srm: 14 mg/dL — ABNORMAL LOW (ref 15–143)

## 2024-10-21 ENCOUNTER — Inpatient Hospital Stay

## 2024-10-21 DIAGNOSIS — I129 Hypertensive chronic kidney disease with stage 1 through stage 4 chronic kidney disease, or unspecified chronic kidney disease: Secondary | ICD-10-CM

## 2024-10-21 DIAGNOSIS — D472 Monoclonal gammopathy: Secondary | ICD-10-CM | POA: Diagnosis not present

## 2024-10-21 DIAGNOSIS — D631 Anemia in chronic kidney disease: Secondary | ICD-10-CM

## 2024-10-24 ENCOUNTER — Encounter (HOSPITAL_COMMUNITY): Payer: Self-pay | Admitting: Vascular Surgery

## 2024-10-24 ENCOUNTER — Ambulatory Visit (HOSPITAL_COMMUNITY)
Admission: RE | Admit: 2024-10-24 | Discharge: 2024-10-24 | Disposition: A | Discharge status: Home / Self Care | Attending: Vascular Surgery | Admitting: Vascular Surgery

## 2024-10-24 ENCOUNTER — Encounter (HOSPITAL_COMMUNITY)
Admission: RE | Disposition: A | Discharge status: Home / Self Care | Payer: Self-pay | Source: Home / Self Care | Attending: Vascular Surgery

## 2024-10-24 ENCOUNTER — Other Ambulatory Visit: Payer: Self-pay

## 2024-10-24 DIAGNOSIS — Z992 Dependence on renal dialysis: Secondary | ICD-10-CM | POA: Insufficient documentation

## 2024-10-24 DIAGNOSIS — I120 Hypertensive chronic kidney disease with stage 5 chronic kidney disease or end stage renal disease: Secondary | ICD-10-CM | POA: Insufficient documentation

## 2024-10-24 DIAGNOSIS — T82858A Stenosis of vascular prosthetic devices, implants and grafts, initial encounter: Secondary | ICD-10-CM | POA: Insufficient documentation

## 2024-10-24 DIAGNOSIS — Y832 Surgical operation with anastomosis, bypass or graft as the cause of abnormal reaction of the patient, or of later complication, without mention of misadventure at the time of the procedure: Secondary | ICD-10-CM | POA: Diagnosis not present

## 2024-10-24 DIAGNOSIS — N186 End stage renal disease: Secondary | ICD-10-CM | POA: Insufficient documentation

## 2024-10-24 DIAGNOSIS — Z79899 Other long term (current) drug therapy: Secondary | ICD-10-CM | POA: Insufficient documentation

## 2024-10-24 HISTORY — PX: VENOUS ANGIOPLASTY: CATH118376

## 2024-10-24 HISTORY — PX: A/V FISTULAGRAM: CATH118298

## 2024-10-24 SURGERY — A/V FISTULAGRAM
Anesthesia: LOCAL | Site: Arm Upper | Laterality: Left

## 2024-10-24 MED ORDER — LIDOCAINE HCL (PF) 1 % IJ SOLN
INTRAMUSCULAR | Status: DC | PRN
  Administered 2024-10-24: 2 mL via SUBCUTANEOUS

## 2024-10-24 MED ORDER — HEPARIN (PORCINE) IN NACL 1000-0.9 UT/500ML-% IV SOLN
INTRAVENOUS | Status: DC | PRN
Start: 2024-10-24 — End: 2024-10-24
  Administered 2024-10-24: 500 mL

## 2024-10-24 MED ORDER — LIDOCAINE HCL (PF) 1 % IJ SOLN
INTRAMUSCULAR | Status: AC
  Filled 2024-10-24: qty 30

## 2024-10-24 MED ORDER — IODIXANOL 320 MG/ML IV SOLN
INTRAVENOUS | Status: DC | PRN
  Administered 2024-10-24: 45 mL via INTRAVENOUS

## 2024-10-24 SURGICAL SUPPLY — 11 items
BALLOON MUSTANG 8.0X40 75 (BALLOONS) IMPLANT
DEVICE INFLATION ENCORE 26 (MISCELLANEOUS) IMPLANT
GLIDEWIRE ADV .035X180CM (WIRE) IMPLANT
KIT MICROPUNCTURE NIT STIFF (SHEATH) IMPLANT
SHEATH PINNACLE R/O II 6F 4CM (SHEATH) IMPLANT
SHEATH PROBE COVER 6X72 (BAG) IMPLANT
STOPCOCK MORSE 400PSI 3WAY (MISCELLANEOUS) IMPLANT
TRAY PV CATH (CUSTOM PROCEDURE TRAY) ×3 IMPLANT
TUBING CIL FLEX 10 FLL-RA (TUBING) IMPLANT
WIRE BENTSON .035X145CM (WIRE) IMPLANT
WIRE TORQFLEX AUST .018X40CM (WIRE) IMPLANT

## 2024-10-24 NOTE — Op Note (Addendum)
    Patient name: Alan Shah MRN: [id number] DOB: September 09, 1951 Sex: male  10/24/2024 Pre-operative Diagnosis: Fistula malfunction Post-operative diagnosis:  Same Surgeon:  Fonda FORBES Rim, MD Procedure Performed: 1.  Left arm fistulogram 2.  Balloon venoplasty left arm fistula at the cephalic vein, axillary vein confluence 8 x 40 3.  Balloon venoplasty left arm fistula at the mid cephalic vein 8 x 40 4.  Access managed with Monocryl suture 5.  Contrast volume 45 mL   Indications: Patient is a 73 year old male with end-stage renal disease currently dialyzed for left arm brachiocephalic fistula.  He has had fistula malfunction, with poor runs of dialysis.  After discussing risk and benefits of left arm fistulogram in an effort to define and improve flow for continued fistula use, [name] elected to proceed.  Findings:   Fistula with 2 areas of focal stenosis greater than 80% - One being at the midportion of the brachiocephalic fistula, with another area at the cephalic vein immediately before the actually vein confluence. Widely patent arterial anastomosis.  No central stenosis.   Procedure:  The patient was identified in the holding area and taken to room 8.  The patient was then placed supine on the table and prepped and draped in the usual sterile fashion.  A time out was called.    The patient was accessed using a micropuncture needle.  This was exchanged for a micropuncture sheath, and fistulogram followed.  See results above.  I elected to attempt intervention on the 2 areas of focal stenosis greater than 80%.  The micropuncture sheath was exchanged for a 6 French sheath, and an 8 x 40 mm balloon was brought into the field.  This was laid across each lesion, and inflated for 2 minutes.  Follow-up venography demonstrated Results resolution of stenosis.  The fistula was less pulsatile and had a nice thrill.  I was happy with this result.  Access was managed with a Monocryl suture.   Patient was taken to holding in stable condition  Fistula remains amenable to future intervention Fistula can continue to be used.   Fonda FORBES Rim MD [facility] [place] Office: [phone]

## 2024-10-24 NOTE — H&P (Signed)
 Hospital Consult    Reason for Consult: Fistula malfunction Requesting Physician: Dr. Dennise MRN #:  [id number]  History of Present Illness: This is a 73 y.o. male with end-stage renal disease currently dialyzed through a left arm brachiocephalic fistula.  This has been working well until recently.  Noted low flows.  Denies prolonged bleeding.  On exam, Davarious was doing well.  Native of [place], he now lives in [place].  He plans to go visit his family again for the holidays.  No recent fistulogram Medical history as listed below.  Past Medical History:  Diagnosis Date   Chronic kidney disease    Stage 4 CKD   Hyperlipidemia    Hypertension    Prostate cancer (HCC)    adenocarcinoma gleason 7    Past Surgical History:  Procedure Laterality Date   AV FISTULA PLACEMENT Left 12/29/2022   Procedure: LEFT ARM BRACHIOCEPHALIC ARTERIOVENOUS (AV) FISTULA CREATION;  Surgeon: Gretta Lonni PARAS, MD;  Location: [facility] OR;  Service: Vascular;  Laterality: Left;  NERVE BLOCK   COLONOSCOPY     ORIF DISPLACED RIGHT TWO-PART PROXIMAL HUMEROUS FX  03-05-2010   PROSTATE BIOPSY  02/24/2013  &   06-07-2013   PROSTATE SURGERY     RADIOACTIVE SEED IMPLANT N/A 09/08/2013   Procedure: RADIOACTIVE SEED IMPLANT;  Surgeon: Mark C Ottelin, MD;  Location: [facility];  Service: Urology;  Laterality: N/A;   RIGHT SHOULDER SURGER FOR FX  2010    No Known Allergies  Prior to Admission medications   Medication Sig Start Date End Date Taking? Authorizing Provider  aspirin EC 81 MG tablet Take 81 mg by mouth in the morning.    [provider]  hydrochlorothiazide  (HYDRODIURIL ) 25 MG tablet Take 25 mg by mouth in the morning.    [provider]  latanoprost (XALATAN) 0.005 % ophthalmic solution Place 1 drop into the left eye at bedtime. 04/05/20   [provider]  Multiple Vitamin (MULTIVITAMIN) tablet Take 1 tablet by mouth in the morning.    [provider]   pravastatin  (PRAVACHOL ) 40 MG tablet TAKE 1 TABLET(40 MG) BY MOUTH EVERY MORNING 10/19/20   Levora Reyes SAUNDERS, MD  verapamil  (CALAN -SR) 180 MG CR tablet Take 1 tablet (180 mg total) by mouth at bedtime. 10/19/20   Levora Reyes SAUNDERS, MD    Social History   Socioeconomic History   Marital status: Single    Spouse name: Not on file   Number of children: Not on file   Years of education: Not on file   Highest education level: Not on file  Occupational History   Occupation: RETIRED  Tobacco Use   Smoking status: Never   Smokeless tobacco: Never  Vaping Use   Vaping status: Never Used  Substance and Sexual Activity   Alcohol use: Not Currently    Alcohol/week: 1.0 standard drink of alcohol    Types: 1 Standard drinks or equivalent per week   Drug use: No   Sexual activity: Yes  Other Topics Concern   Not on file  Social History Narrative   Not on file   Social Drivers of Health   Financial Resource Strain: Low Risk  (08/20/2022)   Received from [facility]   Overall Financial Resource Strain (CARDIA)  Food Insecurity: No Food Insecurity (10/19/2024)   Hunger Vital Sign    Worried About Running Out of Food in the Last Year: Never true    Ran Out of Food in the Last Year: Never  true  Transportation Needs: No Transportation Needs (10/19/2024)   PRAPARE - Administrator, Civil Service (Medical): No    Lack of Transportation (Non-Medical): No  Physical Activity: Sufficiently Active (08/20/2022)   Received from [facility]   Exercise Vital Sign  Stress: No Stress Concern Present (08/20/2022)   Received from [facility] of Occupational Health - Occupational Stress Questionnaire  Social Connections: Moderately Integrated (08/20/2022)   Received from [facility]   Social Connection and Isolation Panel  Intimate Partner Violence: Not At Risk (10/19/2024)   Humiliation, Afraid, Rape, and Kick questionnaire    Fear of Current or Ex-Partner: No     Emotionally Abused: No    Physically Abused: No    Sexually Abused: No   Family History  Problem Relation Age of Onset   Diabetes Mother    Cancer Maternal Uncle        prostate    ROS: Otherwise negative unless mentioned in HPI  Physical Examination  Vitals:   10/24/24 0706  BP: (!) 125/56  Pulse: 67  Resp: 12  SpO2: 96%   There is no height or weight on file to calculate BMI.  General:  WDWN in NAD Gait: Not observed HENT: WNL, normocephalic Pulmonary: normal non-labored breathing, without Rales, rhonchi,  wheezing Cardiac: regular= Abdomen:  soft, NT/ND, no masses Skin: without rashes Vascular Exam/Pulses: Pulsatile left upper extremity fistula Extremities: without ischemic changes, without Gangrene , without cellulitis; without open wounds;  Musculoskeletal: no muscle wasting or atrophy  Neurologic: A&O X 3;  No focal weakness or paresthesias are detected; speech is fluent/normal Psychiatric:  The pt has Normal affect. Lymph:  Unremarkable  CBC    Component Value Date/Time   WBC 3.4 (L) 10/19/2024 1358   WBC 4.5 07/10/2016 1340   RBC 3.83 (L) 10/19/2024 1358   HGB 11.3 (L) 10/19/2024 1358   HGB 11.8 (L) 03/14/2019 1133   HGB 11.5 (L) 02/13/2016 1149   HCT 35.0 (L) 10/19/2024 1358   HCT 34.9 (L) 03/14/2019 1133   HCT 34.3 (L) 02/13/2016 1149   PLT 187 10/19/2024 1358   PLT 172 03/14/2019 1133   MCV 91.4 10/19/2024 1358   MCV 85 03/14/2019 1133   MCV 87.7 02/13/2016 1149   MCH 29.5 10/19/2024 1358   MCHC 32.3 10/19/2024 1358   RDW 15.6 (H) 10/19/2024 1358   RDW 12.6 03/14/2019 1133   RDW 13.6 02/13/2016 1149   LYMPHSABS 1.0 10/19/2024 1358   LYMPHSABS 1.3 03/14/2019 1133   LYMPHSABS 1.3 02/13/2016 1149   MONOABS 0.5 10/19/2024 1358   MONOABS 0.3 02/13/2016 1149   EOSABS 0.0 10/19/2024 1358   EOSABS 0.0 03/14/2019 1133   BASOSABS 0.0 10/19/2024 1358   BASOSABS 0.0 03/14/2019 1133   BASOSABS 0.0 02/13/2016 1149    BMET    Component Value  Date/Time   NA 143 10/19/2024 1358   NA 144 04/19/2020 1113   NA 142 02/13/2016 1149   K 4.6 10/19/2024 1358   K 4.0 02/13/2016 1149   CL 97 (L) 10/19/2024 1358   CO2 28 10/19/2024 1358   CO2 26 02/13/2016 1149   GLUCOSE 101 (H) 10/19/2024 1358   GLUCOSE 91 02/13/2016 1149   BUN 56 (H) 10/19/2024 1358   BUN 48 (H) 04/19/2020 1113   BUN 30.6 (H) 02/13/2016 1149   CREATININE 8.93 (HH) 10/19/2024 1358   CREATININE 1.89 (H) 07/10/2016 1340   CREATININE 1.9 (H) 02/13/2016 1149   CALCIUM 8.9  10/19/2024 1358   CALCIUM 9.8 02/13/2016 1149   GFRNONAA 6 (L) 10/19/2024 1358   GFRNONAA 36 (L) 07/10/2016 1340   GFRAA 23 (L) 04/19/2020 1113   GFRAA 42 (L) 07/10/2016 1340    COAGS: Lab Results  Component Value Date   INR 0.97 08/26/2013   INR 1.02 03/05/2010     ASSESSMENT/PLAN: This is a 73 y.o. male with end-stage renal disease currently dialyzed through a left arm brachiocephalic fistula.  Patient presents today due to some fistula malfunction.  States he has not had prolonged bleeding, but they have had issues with runs.  He and I discussed that I think it is reasonable to perform a fistulogram to ensure that the fistula is functioning adequately as it is pulsatile on physical exam.  After discussing risk benefits, Bader elected to proceed.    Fonda FORBES Rim MD MS [facility] [phone] 10/24/2024  7:33 AM

## 2024-10-25 LAB — UPEP/UIFE/LIGHT CHAINS/TP, 24-HR UR
% BETA, Urine: 23.3 %
ALPHA 1 URINE: 5.6 %
Albumin, U: 33 %
Alpha 2, Urine: 23 %
Free Kappa Lt Chains,Ur: 1015.96 mg/L — ABNORMAL HIGH (ref 1.17–86.46)
Free Kappa/Lambda Ratio: 4.03 (ref 1.83–14.26)
Free Lambda Lt Chains,Ur: 251.84 mg/L — ABNORMAL HIGH (ref 0.27–15.21)
GAMMA GLOBULIN URINE: 15.1 %
M-SPIKE %, Urine: 5.5 % — ABNORMAL HIGH
M-Spike, Mg/24 Hr: 13 mg/(24.h) — ABNORMAL HIGH
Total Protein, Urine-Ur/day: 230 mg/(24.h) — ABNORMAL HIGH (ref 30–150)
Total Protein, Urine: 230.2 mg/dL
Total Volume: >500

## 2024-10-25 LAB — PROTEIN ELECTROPHORESIS, SERUM, WITH REFLEX
A/G Ratio: 1.5 (ref 0.7–1.7)
Albumin ELP: 4.4 g/dL (ref 2.9–4.4)
Alpha-1-Globulin: 0.2 g/dL (ref 0.0–0.4)
Alpha-2-Globulin: 0.7 g/dL (ref 0.4–1.0)
Beta Globulin: 0.9 g/dL (ref 0.7–1.3)
Gamma Globulin: 1.1 g/dL (ref 0.4–1.8)
Globulin, Total: 2.9 g/dL (ref 2.2–3.9)
M-Spike, %: 0.2 g/dL — ABNORMAL HIGH
SPEP Interpretation: 0
Total Protein ELP: 7.3 g/dL (ref 6.0–8.5)

## 2024-10-25 LAB — IMMUNOFIXATION REFLEX, SERUM
IgA: 149 mg/dL (ref 61–437)
IgG (Immunoglobin G), Serum: 1294 mg/dL (ref 603–1613)
IgM (Immunoglobulin M), Srm: 16 mg/dL (ref 15–143)

## 2024-11-16 ENCOUNTER — Other Ambulatory Visit: Payer: Self-pay | Admitting: Radiology

## 2024-11-16 DIAGNOSIS — D472 Monoclonal gammopathy: Secondary | ICD-10-CM

## 2024-11-17 NOTE — H&P (Incomplete)
 Chief Complaint: Renal failure, monoclonal gammopathy of undetermined significance; referred for image guided bone marrow biopsy for further evaluation  Referring Provider(s): Ennever,P  Supervising Physician: Alan Shah  Patient Status: [facility] - Out-pt  History of Present Illness: Alan Shah is a 73 y.o. male with past medical history significant for chronic kidney disease on dialysis, hyperlipidemia, hypertension, prostate cancer who presents now with MGUS.  He is scheduled today for image guided bone marrow biopsy for further evaluation.  *** Patient is Full Code  Past Medical History:  Diagnosis Date   Chronic kidney disease    Stage 4 CKD   Hyperlipidemia    Hypertension    Prostate cancer [facility])    adenocarcinoma gleason 7    Past Surgical History:  Procedure Laterality Date   A/V FISTULAGRAM Left 10/24/2024   Procedure: A/V Fistulagram;  Surgeon: Lanis Fonda BRAVO, MD;  Location: [facility];  Service: Cardiovascular;  Laterality: Left;   AV FISTULA PLACEMENT Left 12/29/2022   Procedure: LEFT ARM BRACHIOCEPHALIC ARTERIOVENOUS (AV) FISTULA CREATION;  Surgeon: Gretta Lonni PARAS, MD;  Location: [facility];  Service: Vascular;  Laterality: Left;  NERVE BLOCK   COLONOSCOPY     ORIF DISPLACED RIGHT TWO-PART PROXIMAL HUMEROUS FX  03-05-2010   PROSTATE BIOPSY  02/24/2013  &   06-07-2013   PROSTATE SURGERY     RADIOACTIVE SEED IMPLANT N/A 09/08/2013   Procedure: RADIOACTIVE SEED IMPLANT;  Surgeon: Oneil JAYSON Rafter, MD;  Location: [facility];  Service: Urology;  Laterality: N/A;   RIGHT SHOULDER SURGER FOR FX  2010   VENOUS ANGIOPLASTY Left 10/24/2024   Procedure: VENOUS ANGIOPLASTY;  Surgeon: Lanis Fonda BRAVO, MD;  Location: [facility];  Service: Cardiovascular;  Laterality: Left;  Cephalic Arch; Cephalic Vein/Proximal AVF    Allergies: Patient has no known allergies.  Medications: Prior to Admission medications  Medication Sig Start Date End Date Taking?  Authorizing Provider  aspirin EC 81 MG tablet Take 81 mg by mouth in the morning.    [provider]  hydrochlorothiazide  (HYDRODIURIL ) 25 MG tablet Take 25 mg by mouth in the morning.    [provider]  latanoprost (XALATAN) 0.005 % ophthalmic solution Place 1 drop into the left eye at bedtime. 04/05/20   [provider]  Multiple Vitamin (MULTIVITAMIN) tablet Take 1 tablet by mouth in the morning.    [provider]  pravastatin  (PRAVACHOL ) 40 MG tablet TAKE 1 TABLET(40 MG) BY MOUTH EVERY MORNING 10/19/20   Levora Reyes SAUNDERS, MD  verapamil  (CALAN -SR) 180 MG CR tablet Take 1 tablet (180 mg total) by mouth at bedtime. 10/19/20   Levora Reyes SAUNDERS, MD     Family History  Problem Relation Age of Onset   Diabetes Mother    Cancer Maternal Uncle        prostate    Social History   Socioeconomic History   Marital status: Single    Spouse name: Not on file   Number of children: Not on file   Years of education: Not on file   Highest education level: Not on file  Occupational History   Occupation: RETIRED  Tobacco Use   Smoking status: Never   Smokeless tobacco: Never  Vaping Use   Vaping status: Never Used  Substance and Sexual Activity   Alcohol use: Not Currently    Alcohol/week: 1.0 standard drink of alcohol    Types: 1 Standard drinks or equivalent per week   Drug use: No  Sexual activity: Yes  Other Topics Concern   Not on file  Social History Narrative   Not on file   Social Drivers of Health   Tobacco Use: Low Risk (10/19/2024)   Patient History    Smoking Tobacco Use: Never    Smokeless Tobacco Use: Never    Passive Exposure: Not on file  Financial Resource Strain: Low Risk (08/20/2022)   Received from [facility]   Overall Financial Resource Strain (CARDIA)    Difficulty of Paying Living Expenses: Not hard at all  Food Insecurity: No Food Insecurity (10/19/2024)   Epic    Worried About Programme Researcher, Broadcasting/film/video in the Last Year:  Never true    Ran Out of Food in the Last Year: Never true  Transportation Needs: No Transportation Needs (10/19/2024)   Epic    Lack of Transportation (Medical): No    Lack of Transportation (Non-Medical): No  Physical Activity: Sufficiently Active (08/20/2022)   Received from [facility]   Exercise Vital Sign  Stress: No Stress Concern Present (08/20/2022)   Received from [facility] - Occupational Stress Questionnaire  Social Connections: Moderately Integrated (08/20/2022)   Received from [facility]   Social Connection and Isolation Panel  Depression (PHQ2-9): Low Risk (10/19/2024)   Depression (PHQ2-9)    PHQ-2 Score: 0  Alcohol Screen: Low Risk (10/19/2024)   Alcohol Screen    Last Alcohol Screening Score (AUDIT): 0  Housing: Low Risk (10/19/2024)   Epic    Unable to Pay for Housing in the Last Year: No    Number of Times Moved in the Last Year: 0    Homeless in the Last Year: No  Utilities: Not At Risk (10/19/2024)   Epic    Threatened with loss of utilities: No  Health Literacy: Not on file       Review of Systems  Vital Signs:   Advance Care Plan: No documents on file    Physical Exam  Imaging: PERIPHERAL VASCULAR CATHETERIZATION Result Date: 10/24/2024 Patient name: Alan Shah MRN: [id number] DOB: 1951/10/27 Sex: male 10/24/2024 Pre-operative Diagnosis: Fistula malfunction Post-operative diagnosis:  Same Surgeon:  Fonda FORBES Rim, MD Procedure Performed: 1.  Left arm fistulogram 2.  Balloon venoplasty left arm fistula at the cephalic vein, axillary vein confluence 8 x 40 3.  Balloon venoplasty left arm fistula at the mid cephalic vein 8 x 40 4.  Access managed with Monocryl suture 5.  Contrast volume 45 mL Indications: Patient is a 73 year old male with end-stage renal disease currently dialyzed for left arm brachiocephalic fistula.  He has had fistula malfunction, with poor runs of dialysis.  After discussing  risk and benefits of left arm fistulogram in an effort to define and improve flow for continued fistula use, Alan Shah elected to proceed. Findings: Fistula with 2 areas of focal stenosis greater than 80% - One being at the midportion of the brachiocephalic fistula, with another area at the cephalic vein immediately before the actually vein confluence. Widely patent arterial anastomosis.  No central stenosis.  Procedure:  The patient was identified in the holding area and taken to room 8.  The patient was then placed supine on the table and prepped and draped in the usual sterile fashion.  A time out was called.  The patient was accessed using a micropuncture needle.  This was exchanged for a micropuncture sheath, and fistulogram followed.  See results above. I elected to attempt intervention on the 2  areas of focal stenosis greater than 80%.  The micropuncture sheath was exchanged for a 6 French sheath, and an 8 x 40 mm balloon was brought into the field.  This was laid across each lesion, and inflated for 2 minutes.  Follow-up venography demonstrated Results resolution of stenosis.  The fistula was less pulsatile and had a nice thrill.  I was happy with this result. Access was managed with a Monocryl suture.  Patient was taken to holding in stable condition Fonda FORBES Rim MD [facility] Office: [phone]    Labs:  CBC: Recent Labs    10/19/24 1358  WBC 3.4*  HGB 11.3*  HCT 35.0*  PLT 187    COAGS: No results for input(s): INR, APTT in the last 8760 hours.  BMP: Recent Labs    10/19/24 1358  NA 143  K 4.6  CL 97*  CO2 28  GLUCOSE 101*  BUN 56*  CALCIUM 8.9  CREATININE 8.93*  GFRNONAA 6*    LIVER FUNCTION TESTS: Recent Labs    10/19/24 1358  BILITOT 0.4  AST 26  ALT 22  ALKPHOS 61  PROT 7.4  ALBUMIN 4.7    TUMOR MARKERS: No results for input(s): AFPTM, CEA, CA199, CHROMGRNA in the last 8760 hours.  Assessment and Plan: 73  y.o. male with past medical history significant for chronic kidney disease on dialysis, hyperlipidemia, hypertension, prostate cancer who presents now with MGUS.  He is scheduled today for image guided bone marrow biopsy for further evaluation.Risks and benefits of procedure was discussed with the patient  including, but not limited to bleeding, infection, damage to adjacent structures or low yield requiring additional tests.  All of the questions were answered and there is agreement to proceed.  Consent signed and in chart.    Thank you for allowing our service to participate in Alan Shah 's care.  Electronically Signed: D. Franky Rakers, PA-C   11/17/2024, 1:39 PM      I spent a total of 20 minutes    in face to face in clinical consultation, greater than 50% of which was counseling/coordinating care for image guided bone marrow biopsy

## 2024-11-18 ENCOUNTER — Ambulatory Visit (HOSPITAL_COMMUNITY)
Admission: RE | Admit: 2024-11-18 | Discharge: 2024-11-18 | Disposition: A | Discharge status: Home / Self Care | Source: Ambulatory Visit | Attending: Hematology & Oncology | Admitting: Hematology & Oncology

## 2024-11-18 ENCOUNTER — Other Ambulatory Visit: Payer: Self-pay

## 2024-11-18 ENCOUNTER — Encounter (HOSPITAL_COMMUNITY): Payer: Self-pay

## 2024-11-18 ENCOUNTER — Inpatient Hospital Stay (HOSPITAL_COMMUNITY): Admission: RE | Admit: 2024-11-18 | Discharge: 2024-11-18 | Attending: Hematology & Oncology

## 2024-11-18 DIAGNOSIS — Z992 Dependence on renal dialysis: Secondary | ICD-10-CM | POA: Diagnosis not present

## 2024-11-18 DIAGNOSIS — N186 End stage renal disease: Secondary | ICD-10-CM | POA: Diagnosis not present

## 2024-11-18 DIAGNOSIS — E785 Hyperlipidemia, unspecified: Secondary | ICD-10-CM | POA: Insufficient documentation

## 2024-11-18 DIAGNOSIS — Z1379 Encounter for other screening for genetic and chromosomal anomalies: Secondary | ICD-10-CM | POA: Diagnosis not present

## 2024-11-18 DIAGNOSIS — D472 Monoclonal gammopathy: Secondary | ICD-10-CM | POA: Insufficient documentation

## 2024-11-18 DIAGNOSIS — C61 Malignant neoplasm of prostate: Secondary | ICD-10-CM | POA: Diagnosis not present

## 2024-11-18 DIAGNOSIS — I120 Hypertensive chronic kidney disease with stage 5 chronic kidney disease or end stage renal disease: Secondary | ICD-10-CM | POA: Insufficient documentation

## 2024-11-18 LAB — CBC WITH DIFFERENTIAL/PLATELET
Abs Immature Granulocytes: 0.01 K/uL (ref 0.00–0.07)
Basophils Absolute: 0 K/uL (ref 0.0–0.1)
Basophils Relative: 1 %
Eosinophils Absolute: 0 K/uL (ref 0.0–0.5)
Eosinophils Relative: 0 %
HCT: 35.4 % — ABNORMAL LOW (ref 39.0–52.0)
Hemoglobin: 11.3 g/dL — ABNORMAL LOW (ref 13.0–17.0)
Immature Granulocytes: 0 %
Lymphocytes Relative: 20 %
Lymphs Abs: 0.6 K/uL — ABNORMAL LOW (ref 0.7–4.0)
MCH: 29.7 pg (ref 26.0–34.0)
MCHC: 31.9 g/dL (ref 30.0–36.0)
MCV: 92.9 fL (ref 80.0–100.0)
Monocytes Absolute: 0.5 K/uL (ref 0.1–1.0)
Monocytes Relative: 17 %
Neutro Abs: 2 K/uL (ref 1.7–7.7)
Neutrophils Relative %: 62 %
Platelets: 165 K/uL (ref 150–400)
RBC: 3.81 MIL/uL — ABNORMAL LOW (ref 4.22–5.81)
RDW: 14.7 % (ref 11.5–15.5)
WBC: 3.2 K/uL — ABNORMAL LOW (ref 4.0–10.5)
nRBC: 0 % (ref 0.0–0.2)

## 2024-11-18 MED ORDER — MIDAZOLAM HCL 2 MG/2ML IJ SOLN
INTRAMUSCULAR | Status: AC
  Filled 2024-11-18: qty 2

## 2024-11-18 MED ORDER — MIDAZOLAM HCL (PF) 2 MG/2ML IJ SOLN
INTRAMUSCULAR | Status: AC | PRN
  Administered 2024-11-18: 1 mg via INTRAVENOUS

## 2024-11-18 MED ORDER — SODIUM CHLORIDE 0.9 % IV SOLN: INTRAVENOUS | Status: DC

## 2024-11-18 MED ORDER — FENTANYL CITRATE (PF) 100 MCG/2ML IJ SOLN
INTRAMUSCULAR | Status: AC | PRN
  Administered 2024-11-18: 50 ug via INTRAVENOUS

## 2024-11-18 MED ORDER — FENTANYL CITRATE (PF) 100 MCG/2ML IJ SOLN
INTRAMUSCULAR | Status: AC
  Filled 2024-11-18: qty 2

## 2024-11-18 NOTE — Procedures (Signed)
 Interventional Radiology Procedure:   Indications: Renal failure and MGU   Procedure: CT guided bone marrow biopsy  Findings: 2 aspirates and 1 core from right ilium  Complications: None     EBL: Minimal, less than 10 ml  Plan: Discharge to home in one hour.   Karen Kinnard R. Philip, MD  Pager: [phone]

## 2024-11-18 NOTE — Progress Notes (Signed)
 1100 Ice bag given to use as needed to low back for comfort as instructed.

## 2024-11-18 NOTE — Discharge Instructions (Addendum)
Discharge Instructions:   Please call Interventional Radiology clinic [phone] with any questions or concerns.  You may remove your dressing and shower tomorrow.    Bone Marrow Aspiration and Bone Marrow Biopsy, Adult, Care After This sheet gives you information about how to care for yourself after your procedure. Your health care provider may also give you more specific instructions. If you have problems or questions, contact your health care provider. What can I expect after the procedure? After the procedure, it is common to have: Mild pain and tenderness. Swelling. Bruising. Follow these instructions at home: Puncture site care  Follow instructions from your health care provider about how to take care of the puncture site. Make sure you: Wash your hands with soap and water before and after you change your bandage (dressing). If soap and water are not available, use hand sanitizer. Change your dressing as told by your health care provider. Check your puncture site every day for signs of infection. Check for: More redness, swelling, or pain. Fluid or blood. Warmth. Pus or a bad smell. Activity Return to your normal activities as told by your health care provider. Ask your health care provider what activities are safe for you. Do not lift anything that is heavier than 10 lb (4.5 kg), or the limit that you are told, until your health care provider says that it is safe. Do not drive for 24 hours if you were given a sedative during your procedure. General instructions  Take over-the-counter and prescription medicines only as told by your health care provider. Do not take baths, swim, or use a hot tub until your health care provider approves. Ask your health care provider if you may take showers. You may only be allowed to take sponge baths. If directed, put ice on the affected area. To do this: Put ice in a plastic bag. Place a towel between your skin and the bag. Leave the ice  on for 20 minutes, 2-3 times a day. Keep all follow-up visits as told by your health care provider. This is important. Contact a health care provider if: Your pain is not controlled with medicine. You have a fever. You have more redness, swelling, or pain around the puncture site. You have fluid or blood coming from the puncture site. Your puncture site feels warm to the touch. You have pus or a bad smell coming from the puncture site. Summary After the procedure, it is common to have mild pain, tenderness, swelling, and bruising. Follow instructions from your health care provider about how to take care of the puncture site and what activities are safe for you. Take over-the-counter and prescription medicines only as told by your health care provider. Contact a health care provider if you have any signs of infection, such as fluid or blood coming from the puncture site. This information is not intended to replace advice given to you by your health care provider. Make sure you discuss any questions you have with your health care provider. Document Revised: 04/05/2019 Document Reviewed: 04/05/2019 Elsevier Patient Education  2023 Elsevier Inc.   Moderate Conscious Sedation, Adult, Care After This sheet gives you information about how to care for yourself after your procedure. Your health care provider may also give you more specific instructions. If you have problems or questions, contact your health care provider. What can I expect after the procedure? After the procedure, it is common to have: Sleepiness for several hours. Impaired judgment for several hours. Difficulty with balance. Vomiting if   you eat too soon. Follow these instructions at home: For the time period you were told by your health care provider: Rest. Do not participate in activities where you could fall or become injured. Do not drive or use machinery. Do not drink alcohol. Do not take sleeping pills or medicines that  cause drowsiness. Do not make important decisions or sign legal documents. Do not take care of children on your own. Eating and drinking  Follow the diet recommended by your health care provider. Drink enough fluid to keep your urine pale yellow. If you vomit: Drink water, juice, or soup when you can drink without vomiting. Make sure you have little or no nausea before eating solid foods. General instructions Take over-the-counter and prescription medicines only as told by your health care provider. Have a responsible adult stay with you for the time you are told. It is important to have someone help care for you until you are awake and alert. Do not smoke. Keep all follow-up visits as told by your health care provider. This is important. Contact a health care provider if: You are still sleepy or having trouble with balance after 24 hours. You feel light-headed. You keep feeling nauseous or you keep vomiting. You develop a rash. You have a fever. You have redness or swelling around the IV site. Get help right away if: You have trouble breathing. You have new-onset confusion at home. Summary After the procedure, it is common to feel sleepy, have impaired judgment, or feel nauseous if you eat too soon. Rest after you get home. Know the things you should not do after the procedure. Follow the diet recommended by your health care provider and drink enough fluid to keep your urine pale yellow. Get help right away if you have trouble breathing or new-onset confusion at home. This information is not intended to replace advice given to you by your health care provider. Make sure you discuss any questions you have with your health care provider. Document Revised: 03/16/2020 Document Reviewed: 10/13/2019 Elsevier Patient Education  2023 Elsevier Inc.  

## 2024-11-22 LAB — SURGICAL PATHOLOGY

## 2024-11-28 ENCOUNTER — Encounter (HOSPITAL_COMMUNITY): Payer: Self-pay

## 2024-11-29 ENCOUNTER — Encounter (HOSPITAL_COMMUNITY): Payer: Self-pay

## 2024-11-29 ENCOUNTER — Other Ambulatory Visit: Payer: Self-pay

## 2024-11-29 DIAGNOSIS — I129 Hypertensive chronic kidney disease with stage 1 through stage 4 chronic kidney disease, or unspecified chronic kidney disease: Secondary | ICD-10-CM

## 2024-11-29 DIAGNOSIS — D631 Anemia in chronic kidney disease: Secondary | ICD-10-CM

## 2024-11-30 ENCOUNTER — Inpatient Hospital Stay (HOSPITAL_BASED_OUTPATIENT_CLINIC_OR_DEPARTMENT_OTHER): Admitting: Hematology & Oncology

## 2024-11-30 ENCOUNTER — Inpatient Hospital Stay: Attending: Hematology & Oncology

## 2024-11-30 ENCOUNTER — Encounter: Payer: Self-pay | Admitting: Hematology & Oncology

## 2024-11-30 VITALS — BP 129/54 | HR 58 | Temp 97.7°F | Resp 20 | Ht 69.0 in | Wt 149.0 lb

## 2024-11-30 DIAGNOSIS — N19 Unspecified kidney failure: Secondary | ICD-10-CM | POA: Insufficient documentation

## 2024-11-30 DIAGNOSIS — C9000 Multiple myeloma not having achieved remission: Secondary | ICD-10-CM | POA: Insufficient documentation

## 2024-11-30 DIAGNOSIS — D631 Anemia in chronic kidney disease: Secondary | ICD-10-CM

## 2024-11-30 DIAGNOSIS — I129 Hypertensive chronic kidney disease with stage 1 through stage 4 chronic kidney disease, or unspecified chronic kidney disease: Secondary | ICD-10-CM

## 2024-11-30 LAB — CBC WITH DIFFERENTIAL (CANCER CENTER ONLY)
Abs Immature Granulocytes: 0.01 K/uL (ref 0.00–0.07)
Basophils Absolute: 0 K/uL (ref 0.0–0.1)
Basophils Relative: 1 %
Eosinophils Absolute: 0 K/uL (ref 0.0–0.5)
Eosinophils Relative: 0 %
HCT: 36.6 % — ABNORMAL LOW (ref 39.0–52.0)
Hemoglobin: 11.8 g/dL — ABNORMAL LOW (ref 13.0–17.0)
Immature Granulocytes: 0 %
Lymphocytes Relative: 27 %
Lymphs Abs: 1 K/uL (ref 0.7–4.0)
MCH: 29.5 pg (ref 26.0–34.0)
MCHC: 32.2 g/dL (ref 30.0–36.0)
MCV: 91.5 fL (ref 80.0–100.0)
Monocytes Absolute: 0.6 K/uL (ref 0.1–1.0)
Monocytes Relative: 16 %
Neutro Abs: 2 K/uL (ref 1.7–7.7)
Neutrophils Relative %: 56 %
Platelet Count: 168 K/uL (ref 150–400)
RBC: 4 MIL/uL — ABNORMAL LOW (ref 4.22–5.81)
RDW: 15 % (ref 11.5–15.5)
WBC Count: 3.6 K/uL — ABNORMAL LOW (ref 4.0–10.5)
nRBC: 0 % (ref 0.0–0.2)

## 2024-11-30 LAB — CMP (CANCER CENTER ONLY)
ALT: 21 U/L (ref 0–44)
AST: 31 U/L (ref 15–41)
Albumin: 5.1 g/dL — ABNORMAL HIGH (ref 3.5–5.0)
Alkaline Phosphatase: 77 U/L (ref 38–126)
Anion gap: 16 — ABNORMAL HIGH (ref 5–15)
BUN: 50 mg/dL — ABNORMAL HIGH (ref 8–23)
CO2: 29 mmol/L (ref 22–32)
Calcium: 9.5 mg/dL (ref 8.9–10.3)
Chloride: 97 mmol/L — ABNORMAL LOW (ref 98–111)
Creatinine: 9.01 mg/dL (ref 0.61–1.24)
GFR, Estimated: 6 mL/min — ABNORMAL LOW
Glucose, Bld: 103 mg/dL — ABNORMAL HIGH (ref 70–99)
Potassium: 5.1 mmol/L (ref 3.5–5.1)
Sodium: 142 mmol/L (ref 135–145)
Total Bilirubin: 0.4 mg/dL (ref 0.0–1.2)
Total Protein: 8.2 g/dL — ABNORMAL HIGH (ref 6.5–8.1)

## 2024-11-30 LAB — TYPE AND SCREEN
ABO/RH(D): O POS
Antibody Screen: NEGATIVE

## 2024-11-30 NOTE — Progress Notes (Signed)
 Critical result received from lab of creatinine 9.01 Dr. Timmy aware and no new orders received.

## 2024-11-30 NOTE — Progress Notes (Signed)
 " Hematology and Oncology Follow Up Visit  Alan Shah [id number] 08-May-1951 73 y.o. 11/30/2024   Principle Diagnosis:  IgG Kappa myeloma - ? Smoldering -- (-) cytogenetics/(-) FISH  Current Therapy:   Faspro/Velcade - start cycle #1 on 12/09/2024     Interim History:  Alan Shah is back for follow-up.  We first saw him back on 10/19/2024.  At that time, he was in the process of being evaluated for a kidney transplant for renal failure.  He is on hemodialysis.  We did go ahead and do some myeloma studies on him.  He was found to have a IgG kappa protein.  His monoclonal spike was quite low at 0.2 g/dL.  His IgG level was 1300 mg/dL.  His kappa light chain was 31 mg/dL.  We did do a bone marrow biopsy on him.  This was done on 11/18/2024.  The pathology report ([id number]) showed a plasma cell neoplasm.  He has 17% plasma cells.  As such, this concern would be construed as a smoldering myeloma.  Thankfully, he had normal cytogenetics.  He had a negative FISH panel.  I think the problem is that he is not going to have a transplant if he has this myeloma/smoldering myeloma.  He is getting dialysis.  He is doing well with dialysis.  He does make his own urine.  We did do a 24-hour urine on him.  Surprising, he has incredible amount of Kappa light chain in the urine.  He was found to have 1015 mg/L of kappa light chain.  I suppose it is possible that some of the renal failure could be from light chain deposition.  As such, I think we are going to have to treat him.  I think would be reasonable to treat him with Faspro and Velcade.  I think this would be a reasonable approach for him.  I think he would be able to tolerate this.  I think that he would respond.  We could certainly add additional agents if necessary.  He had a good Christmas.  He is eating well.  He is exercising every day.  He is in great shape.  Overall, I would say that his performance status is probably ECOG  1.  Medications: Current Medications[1]  Allergies: Allergies[2]  Past Medical History, Surgical history, Social history, and Family History were reviewed and updated.  Review of Systems: Review of Systems  Constitutional: Negative.   HENT:  Negative.    Eyes: Negative.   Respiratory: Negative.    Cardiovascular: Negative.   Gastrointestinal: Negative.   Endocrine: Negative.   Genitourinary: Negative.    Musculoskeletal: Negative.   Skin: Negative.   Neurological: Negative.   Hematological: Negative.   Psychiatric/Behavioral: Negative.      Physical Exam:  height is 5' 9 (1.753 m) and weight is 149 lb (67.6 kg). His oral temperature is 97.7 F (36.5 C). His blood pressure is 129/54 (abnormal) and his pulse is 58 (abnormal). His respiration is 20 and oxygen saturation is 100%.   Wt Readings from Last 3 Encounters:  11/30/24 149 lb (67.6 kg)  11/18/24 145 lb 1 oz (65.8 kg)  10/19/24 151 lb 12.8 oz (68.9 kg)    Physical Exam Vitals reviewed.  HENT:     Head: Normocephalic and atraumatic.  Eyes:     Pupils: Pupils are equal, round, and reactive to light.  Cardiovascular:     Rate and Rhythm: Normal rate and regular rhythm.     Heart sounds:  Normal heart sounds.  Pulmonary:     Effort: Pulmonary effort is normal.     Breath sounds: Normal breath sounds.  Abdominal:     General: Bowel sounds are normal.     Palpations: Abdomen is soft.  Musculoskeletal:        General: No tenderness or deformity. Normal range of motion.     Cervical back: Normal range of motion.  Lymphadenopathy:     Cervical: No cervical adenopathy.  Skin:    General: Skin is warm and dry.     Findings: No erythema or rash.  Neurological:     Mental Status: He is alert and oriented to person, place, and time.  Psychiatric:        Behavior: Behavior normal.        Thought Content: Thought content normal.        Judgment: Judgment normal.     Lab Results  Component Value Date   WBC 3.6  (L) 11/30/2024   HGB 11.8 (L) 11/30/2024   HCT 36.6 (L) 11/30/2024   MCV 91.5 11/30/2024   PLT 168 11/30/2024     Chemistry      Component Value Date/Time   NA 142 11/30/2024 1307   NA 144 04/19/2020 1113   NA 142 02/13/2016 1149   K 5.1 11/30/2024 1307   K 4.0 02/13/2016 1149   CL 97 (L) 11/30/2024 1307   CO2 29 11/30/2024 1307   CO2 26 02/13/2016 1149   BUN 50 (H) 11/30/2024 1307   BUN 48 (H) 04/19/2020 1113   BUN 30.6 (H) 02/13/2016 1149   CREATININE 9.01 (HH) 11/30/2024 1307   CREATININE 1.89 (H) 07/10/2016 1340   CREATININE 1.9 (H) 02/13/2016 1149      Component Value Date/Time   CALCIUM 9.5 11/30/2024 1307   CALCIUM 9.8 02/13/2016 1149   ALKPHOS 77 11/30/2024 1307   ALKPHOS 50 02/13/2016 1149   AST 31 11/30/2024 1307   AST 22 02/13/2016 1149   ALT 21 11/30/2024 1307   ALT 18 02/13/2016 1149   BILITOT 0.4 11/30/2024 1307   BILITOT 0.49 02/13/2016 1149      Impression and Plan: Alan Shah is a very nice 73 year old Afro-American male.  He has renal failure.  He has IgG kappa myeloma/smoldering myeloma.  I would had to suspect that with such a high level of kappa light chain in his urine that he may qualify for myeloma.  When he is on the transplant list for his kidney transplant.  However, he was found to have the MGUS and as such, I do not think he will have a transplant if we cannot get the myeloma/smoldering myeloma in remission.  Again, we will see about treating him with Faspro/Velcade.  I think this would be a reasonable approach for him.  We certainly can follow his levels in the blood to see how he is doing.  We will see about getting him started on treatment.  We will try to get treatment started in the next couple weeks.   [name], MD 12/31/20255:00 PM    [1]  Current Outpatient Medications:    aspirin EC 81 MG tablet, Take 81 mg by mouth in the morning., Disp: , Rfl:    hydrochlorothiazide  (HYDRODIURIL ) 25 MG tablet, Take 25 mg by mouth in  the morning., Disp: , Rfl:    latanoprost (XALATAN) 0.005 % ophthalmic solution, Place 1 drop into the left eye at bedtime., Disp: , Rfl:    Methoxy PEG-Epoetin  Beta (MIRCERA IJ), 30 mcg., Disp: , Rfl:    Multiple Vitamin (MULTIVITAMIN) tablet, Take 1 tablet by mouth in the morning., Disp: , Rfl:    pravastatin  (PRAVACHOL ) 40 MG tablet, TAKE 1 TABLET(40 MG) BY MOUTH EVERY MORNING, Disp: 90 tablet, Rfl: 2   VELPHORO 500 MG chewable tablet, 1 tablet 3 (three) times daily with meals., Disp: , Rfl:    verapamil  (CALAN -SR) 180 MG CR tablet, Take 1 tablet (180 mg total) by mouth at bedtime., Disp: 90 tablet, Rfl: 2 [2] No Known Allergies  "

## 2024-11-30 NOTE — Progress Notes (Signed)
 START OFF PATHWAY REGIMEN - Multiple Myeloma and Other Plasma Cell Dyscrasias   [id number]:[facility]Bord (Daratumumab SUBQ + Cyclophosphamide PO + Bortezomib SUBQ + Dexamethasone  PO/IV) q28 Days Followed by Daratumumab SUBQ q28 Days for up to 2 Years:   Cycles 1 and 2: A cycle is every 28 days:     Cyclophosphamide      Dexamethasone       Daratumumab and hyaluronidase-fihj      Bortezomib    Cycles 3 through 6: A cycle is every 28 days:     Cyclophosphamide      Dexamethasone       Dexamethasone       Daratumumab and hyaluronidase-fihj      Bortezomib    Cycles 7 and beyond (up to 2 years): A cycle is every 28 days:     Daratumumab and hyaluronidase-fihj   **Always confirm dose/schedule in your pharmacy ordering system**  Clinician Citation:   Patient Characteristics: Multiple Myeloma, Newly Diagnosed, Transplant Ineligible or Deferred, Standard Risk Disease Classification: Multiple Myeloma Therapeutic Status: Newly Diagnosed R2-ISS Staging: III Is Patient Eligible for Transplant<= Transplant Ineligible or Deferred Risk Status: Standard Risk Intent of Therapy: Non-Curative / Palliative Intent, Discussed with Patient

## 2024-12-01 LAB — IGG, IGA, IGM
IgA: 146 mg/dL (ref 61–437)
IgG (Immunoglobin G), Serum: 1292 mg/dL (ref 603–1613)
IgM (Immunoglobulin M), Srm: 15 mg/dL (ref 15–143)

## 2024-12-01 LAB — PRETREATMENT RBC PHENOTYPE

## 2024-12-01 LAB — KAPPA/LAMBDA LIGHT CHAINS
Kappa free light chain: 366.7 mg/L — ABNORMAL HIGH (ref 3.3–19.4)
Kappa, lambda light chain ratio: 2.92 — ABNORMAL HIGH (ref 0.26–1.65)
Lambda free light chains: 125.7 mg/L — ABNORMAL HIGH (ref 5.7–26.3)

## 2024-12-02 ENCOUNTER — Encounter: Payer: Self-pay | Admitting: Hematology & Oncology

## 2024-12-02 NOTE — Progress Notes (Signed)
 Pharmacist Chemotherapy Monitoring - Initial Assessment    Anticipated start date: 12/09/2024   The following has been reviewed per standard work regarding the patient's treatment regimen: The patient's diagnosis, treatment plan and drug doses, and organ/hematologic function Lab orders and baseline tests specific to treatment regimen  The treatment plan start date, drug sequencing, and pre-medications Prior authorization status  Patient's documented medication list, including drug-drug interaction screen and prescriptions for anti-emetics and supportive care specific to the treatment regimen The drug concentrations, fluid compatibility, administration routes, and timing of the medications to be used The patient's access for treatment and lifetime cumulative dose history, if applicable  The patient's medication allergies and previous infusion related reactions, if applicable   Changes made to treatment plan:  pre-medications - Dexamethasone  decreased to 20 mg and Nursing communication updated to include ok to treat with elevated Serum creatinine given HD status.  Follow up needed:  N/A   Alan Shah, [facility], 12/02/2024  3:26 PM

## 2024-12-04 ENCOUNTER — Other Ambulatory Visit: Payer: Self-pay

## 2024-12-05 LAB — PROTEIN ELECTROPHORESIS, SERUM, WITH REFLEX
A/G Ratio: 1.5 (ref 0.7–1.7)
Albumin ELP: 4.5 g/dL — ABNORMAL HIGH (ref 2.9–4.4)
Alpha-1-Globulin: 0.2 g/dL (ref 0.0–0.4)
Alpha-2-Globulin: 0.7 g/dL (ref 0.4–1.0)
Beta Globulin: 0.9 g/dL (ref 0.7–1.3)
Gamma Globulin: 1.2 g/dL (ref 0.4–1.8)
Globulin, Total: 3 g/dL (ref 2.2–3.9)
Total Protein ELP: 7.5 g/dL (ref 6.0–8.5)

## 2024-12-09 ENCOUNTER — Inpatient Hospital Stay: Attending: Hematology & Oncology

## 2024-12-09 ENCOUNTER — Encounter: Payer: Self-pay | Admitting: Family

## 2024-12-09 ENCOUNTER — Inpatient Hospital Stay

## 2024-12-09 ENCOUNTER — Inpatient Hospital Stay: Admitting: Family

## 2024-12-09 VITALS — BP 115/54 | HR 58 | Temp 98.1°F | Resp 18 | Wt 147.0 lb

## 2024-12-09 DIAGNOSIS — Z5112 Encounter for antineoplastic immunotherapy: Secondary | ICD-10-CM | POA: Diagnosis present

## 2024-12-09 DIAGNOSIS — N184 Chronic kidney disease, stage 4 (severe): Secondary | ICD-10-CM

## 2024-12-09 DIAGNOSIS — C9000 Multiple myeloma not having achieved remission: Secondary | ICD-10-CM

## 2024-12-09 DIAGNOSIS — I129 Hypertensive chronic kidney disease with stage 1 through stage 4 chronic kidney disease, or unspecified chronic kidney disease: Secondary | ICD-10-CM

## 2024-12-09 LAB — CMP (CANCER CENTER ONLY)
ALT: 19 U/L (ref 0–44)
AST: 27 U/L (ref 15–41)
Albumin: 5 g/dL (ref 3.5–5.0)
Alkaline Phosphatase: 81 U/L (ref 38–126)
Anion gap: 17 — ABNORMAL HIGH (ref 5–15)
BUN: 38 mg/dL — ABNORMAL HIGH (ref 8–23)
CO2: 29 mmol/L (ref 22–32)
Calcium: 9 mg/dL (ref 8.9–10.3)
Chloride: 94 mmol/L — ABNORMAL LOW (ref 98–111)
Creatinine: 7.78 mg/dL (ref 0.61–1.24)
GFR, Estimated: 7 mL/min — ABNORMAL LOW
Glucose, Bld: 111 mg/dL — ABNORMAL HIGH (ref 70–99)
Potassium: 4.3 mmol/L (ref 3.5–5.1)
Sodium: 139 mmol/L (ref 135–145)
Total Bilirubin: 0.4 mg/dL (ref 0.0–1.2)
Total Protein: 7.7 g/dL (ref 6.5–8.1)

## 2024-12-09 LAB — CBC WITH DIFFERENTIAL (CANCER CENTER ONLY)
Abs Immature Granulocytes: 0.01 K/uL (ref 0.00–0.07)
Basophils Absolute: 0 K/uL (ref 0.0–0.1)
Basophils Relative: 1 %
Eosinophils Absolute: 0 K/uL (ref 0.0–0.5)
Eosinophils Relative: 0 %
HCT: 37.1 % — ABNORMAL LOW (ref 39.0–52.0)
Hemoglobin: 11.8 g/dL — ABNORMAL LOW (ref 13.0–17.0)
Immature Granulocytes: 0 %
Lymphocytes Relative: 27 %
Lymphs Abs: 0.8 K/uL (ref 0.7–4.0)
MCH: 29.4 pg (ref 26.0–34.0)
MCHC: 31.8 g/dL (ref 30.0–36.0)
MCV: 92.3 fL (ref 80.0–100.0)
Monocytes Absolute: 0.4 K/uL (ref 0.1–1.0)
Monocytes Relative: 14 %
Neutro Abs: 1.7 K/uL (ref 1.7–7.7)
Neutrophils Relative %: 58 %
Platelet Count: 180 K/uL (ref 150–400)
RBC: 4.02 MIL/uL — ABNORMAL LOW (ref 4.22–5.81)
RDW: 15.4 % (ref 11.5–15.5)
WBC Count: 2.9 K/uL — ABNORMAL LOW (ref 4.0–10.5)
nRBC: 0 % (ref 0.0–0.2)

## 2024-12-09 LAB — LACTATE DEHYDROGENASE: LDH: 167 U/L (ref 105–235)

## 2024-12-09 MED ORDER — ACETAMINOPHEN 325 MG PO TABS
650.0000 mg | ORAL_TABLET | Freq: Once | ORAL | Status: AC
  Administered 2024-12-09: 650 mg via ORAL
  Filled 2024-12-09: qty 2

## 2024-12-09 MED ORDER — BORTEZOMIB CHEMO SQ INJECTION 3.5 MG (2.5MG/ML)
1.3000 mg/m2 | Freq: Once | INTRAMUSCULAR | Status: AC
  Administered 2024-12-09: 2.25 mg via SUBCUTANEOUS
  Filled 2024-12-09: qty 0.9

## 2024-12-09 MED ORDER — DIPHENHYDRAMINE HCL 25 MG PO CAPS
50.0000 mg | ORAL_CAPSULE | Freq: Once | ORAL | Status: AC
  Administered 2024-12-09: 50 mg via ORAL
  Filled 2024-12-09: qty 2

## 2024-12-09 MED ORDER — MONTELUKAST SODIUM 10 MG PO TABS
10.0000 mg | ORAL_TABLET | Freq: Once | ORAL | Status: AC
  Administered 2024-12-09: 10 mg via ORAL
  Filled 2024-12-09: qty 1

## 2024-12-09 MED ORDER — DARATUMUMAB-HYALURONIDASE-FIHJ 1800-30000 MG-UT/15ML ~~LOC~~ SOLN
1800.0000 mg | Freq: Once | SUBCUTANEOUS | Status: AC
  Administered 2024-12-09: 1800 mg via SUBCUTANEOUS
  Filled 2024-12-09: qty 15

## 2024-12-09 MED ORDER — DEXAMETHASONE 4 MG PO TABS
20.0000 mg | ORAL_TABLET | Freq: Once | ORAL | Status: AC
  Administered 2024-12-09: 20 mg via ORAL
  Filled 2024-12-09: qty 5

## 2024-12-09 NOTE — Progress Notes (Signed)
 Critical result received from lab of Creatinine 7.78 Dr. Timmy aware. No new orders received. Per Dr. Timmy ok to proceed with treatment despite creatinine.

## 2024-12-09 NOTE — Progress Notes (Signed)
 VSS 2 hours post treatment. Pt denies distress. AVS given to patient. Pt instructed to call our office if issues occur. Pt verbalized understanding of instructions.

## 2024-12-09 NOTE — Progress Notes (Signed)
 " Hematology and Oncology Follow Up Visit  Alan Shah [id number] 1951/10/27 74 y.o. 12/09/2024   Principle Diagnosis:  IgG Kappa myeloma - ? Smoldering -- (-) cytogenetics/(-) FISH   Current Therapy:        Faspro/Velcade  - started 12/09/2024   Interim History:  [name] is here today for follow-up and to start treatment with Faspro and Velcade . He is doing well and has no complaints at this time.  At last visit M-spike was not observed, IgG level was 1,292 mg/dL and Kappa light chains 36.67 mg/dL.  He is tolerating dialysis T, Th, Sat nicely with some occasional muscle cramping in the legs. This has been tolerable over all.  No swelling, numbness or tingling in his extremities.  No falls or syncope.  No fever, chills, n/v, cough, rash, dizziness, SOB, chest pain, palpitations, abdominal pain or changes in bowel or bladder habits.  Appetite is good and he is on 32 oz fluid restriction daily. Weight is 147 lbs.   ECOG Performance Status: 1 - Symptomatic but completely ambulatory  Medications:  Allergies as of 12/09/2024   No Known Allergies      Medication List        Accurate as of December 09, 2024  9:13 AM. If you have any questions, ask your nurse or doctor.          aspirin EC 81 MG tablet Take 81 mg by mouth in the morning.   hydrochlorothiazide  25 MG tablet Commonly known as: HYDRODIURIL  Take 25 mg by mouth in the morning.   latanoprost 0.005 % ophthalmic solution Commonly known as: XALATAN Place 1 drop into the left eye at bedtime.   MIRCERA IJ 30 mcg.   multivitamin tablet Take 1 tablet by mouth in the morning.   pravastatin  40 MG tablet Commonly known as: PRAVACHOL  TAKE 1 TABLET(40 MG) BY MOUTH EVERY MORNING   Velphoro 500 MG chewable tablet Generic drug: sucroferric oxyhydroxide 1 tablet 3 (three) times daily with meals.   verapamil  180 MG CR tablet Commonly known as: CALAN -SR Take 1 tablet (180 mg total) by mouth at bedtime.         Allergies: Allergies[1]  Past Medical History, Surgical history, Social history, and Family History were reviewed and updated.  Review of Systems: All other 10 point review of systems is negative.   Physical Exam:  vitals were not taken for this visit.   Wt Readings from Last 3 Encounters:  11/30/24 149 lb (67.6 kg)  11/18/24 145 lb 1 oz (65.8 kg)  10/19/24 151 lb 12.8 oz (68.9 kg)    Ocular: Sclerae unicteric, pupils equal, round and reactive to light Ear-nose-throat: Oropharynx clear, dentition fair Lymphatic: No cervical or supraclavicular adenopathy Lungs no rales or rhonchi, good excursion bilaterally Heart regular rate and rhythm, no murmur appreciated Abd soft, nontender, positive bowel sounds MSK no focal spinal tenderness, no joint edema Neuro: non-focal, well-oriented, appropriate affect Breasts: Deferred   Lab Results  Component Value Date   WBC 2.9 (L) 12/09/2024   HGB 11.8 (L) 12/09/2024   HCT 37.1 (L) 12/09/2024   MCV 92.3 12/09/2024   PLT 180 12/09/2024   Lab Results  Component Value Date   FERRITIN 258 02/13/2016   Lab Results  Component Value Date   RETICCTPCT 0.74 (L) 02/13/2016   RBC 4.02 (L) 12/09/2024   RETICCTABS 28.93 (L) 02/13/2016   Lab Results  Component Value Date   KPAFRELGTCHN 366.7 (H) 11/30/2024   LAMBDASER 125.7 (H) 11/30/2024  KAPLAMBRATIO 2.92 (H) 11/30/2024   Lab Results  Component Value Date   IGGSERUM 1,292 11/30/2024   IGA 146 11/30/2024   IGMSERUM 15 11/30/2024   Lab Results  Component Value Date   TOTALPROTELP 7.5 11/30/2024   ALBUMINELP 4.5 (H) 11/30/2024   A1GS 0.2 11/30/2024   A2GS 0.7 11/30/2024   BETS 0.9 11/30/2024   BETA2SER 0.4 07/10/2016   GAMS 1.2 11/30/2024   MSPIKE Not Observed 11/30/2024   SPEI SEE NOTE 07/10/2016     Chemistry      Component Value Date/Time   NA 142 11/30/2024 1307   NA 144 04/19/2020 1113   NA 142 02/13/2016 1149   K 5.1 11/30/2024 1307   K 4.0 02/13/2016 1149    CL 97 (L) 11/30/2024 1307   CO2 29 11/30/2024 1307   CO2 26 02/13/2016 1149   BUN 50 (H) 11/30/2024 1307   BUN 48 (H) 04/19/2020 1113   BUN 30.6 (H) 02/13/2016 1149   CREATININE 9.01 (HH) 11/30/2024 1307   CREATININE 1.89 (H) 07/10/2016 1340   CREATININE 1.9 (H) 02/13/2016 1149      Component Value Date/Time   CALCIUM 9.5 11/30/2024 1307   CALCIUM 9.8 02/13/2016 1149   ALKPHOS 77 11/30/2024 1307   ALKPHOS 50 02/13/2016 1149   AST 31 11/30/2024 1307   AST 22 02/13/2016 1149   ALT 21 11/30/2024 1307   ALT 18 02/13/2016 1149   BILITOT 0.4 11/30/2024 1307   BILITOT 0.49 02/13/2016 1149       Impression and Plan: [name] is a very pleasant 74 yo African American gentleman with renal failure secondary to IgG kappa myeloma/smoldering myeloma.  We will proceed with treatment today as planned per Dr. Timmy.  Weekly lab and treatment, follow-up in 4 weeks.   Lauraine Pepper, NP 1/9/20269:13 AM     [1] No Known Allergies  "

## 2024-12-09 NOTE — Patient Instructions (Signed)
 Bortezomib Injection What is this medication? BORTEZOMIB (bor TEZ oh mib) treats lymphoma. It may also be used to treat multiple myeloma, a type of bone marrow cancer. It works by blocking a protein that causes cancer cells to grow and multiply. This helps to slow or stop the spread of cancer cells. This medicine may be used for other purposes; ask your health care provider or pharmacist if you have questions. COMMON BRAND NAME(S): BORUZU, Velcade What should I tell my care team before I take this medication? They need to know if you have any of these conditions: Dehydration Diabetes Heart disease Liver disease Tingling of the fingers or toes or other nerve disorder An unusual or allergic reaction to bortezomib, other medications, foods, dyes, or preservatives If you or your partner are pregnant or trying to get pregnant Breastfeeding How should I use this medication? This medication is injected into a vein or under the skin. It is given by your care team in a hospital or clinic setting. Talk to your care team about the use of this medication in children. Special care may be needed. Overdosage: If you think you have taken too much of this medicine contact a poison control center or emergency room at once. NOTE: This medicine is only for you. Do not share this medicine with others. What if I miss a dose? Keep appointments for follow-up doses. It is important not to miss your dose. Call your care team if you are unable to keep an appointment. What may interact with this medication? Ketoconazole Rifampin This list may not describe all possible interactions. Give your health care provider a list of all the medicines, herbs, non-prescription drugs, or dietary supplements you use. Also tell them if you smoke, drink alcohol, or use illegal drugs. Some items may interact with your medicine. What should I watch for while using this medication? Your condition will be monitored carefully while you  are receiving this medication. You may need blood work while taking this medication. This medication may affect your coordination, reaction time, or judgment. Do not drive or operate machinery until you know how this medication affects you. Sit up or stand slowly to reduce the risk of dizzy or fainting spells. Drinking alcohol with this medication can increase the risk of these side effects. This medication may increase your risk of getting an infection. Call your care team for advice if you get a fever, chills, sore throat, or other symptoms of a cold or flu. Do not treat yourself. Try to avoid being around people who are sick. Check with your care team if you have severe diarrhea, nausea, and vomiting, or if you sweat a lot. The loss of too much body fluid may make it dangerous for you to take this medication. Talk to your care team if you may be pregnant. Serious birth defects can occur if you take this medication during pregnancy and for 7 months after the last dose. You will need a negative pregnancy test before starting this medication. Contraception is recommended while taking this medication and for 7 months after the last dose. Your care team can help you find the option that works for you. If your partner can get pregnant, use a condom during sex while taking this medication and for 4 months after the last dose. Do not breastfeed while taking this medication and for 2 months after the last dose. This medication may cause infertility. Talk to your care team if you are concerned about your fertility. What side  effects may I notice from receiving this medication? Side effects that you should report to your care team as soon as possible: Allergic reactions--skin rash, itching, hives, swelling of the face, lips, tongue, or throat Bleeding--bloody or black, tar-like stools, vomiting blood or brown material that looks like coffee grounds, red or dark brown urine, small red or purple spots on skin,  unusual bruising or bleeding Bleeding in the brain--severe headache, stiff neck, confusion, dizziness, change in vision, numbness or weakness of the face, arm, or leg, trouble speaking, trouble walking, vomiting Bowel blockage--stomach cramping, unable to have a bowel movement or pass gas, loss of appetite, vomiting Heart failure--shortness of breath, swelling of the ankles, feet, or hands, sudden weight gain, unusual weakness or fatigue Infection--fever, chills, cough, sore throat, wounds that don't heal, pain or trouble when passing urine, general feeling of discomfort or being unwell Liver injury--right upper belly pain, loss of appetite, nausea, light-colored stool, dark yellow or brown urine, yellowing skin or eyes, unusual weakness or fatigue Low blood pressure--dizziness, feeling faint or lightheaded, blurry vision Lung injury--shortness of breath or trouble breathing, cough, spitting up blood, chest pain, fever Pain, tingling, or numbness in the hands or feet Severe or prolonged diarrhea Stomach pain, bloody diarrhea, pale skin, unusual weakness or fatigue, decrease in the amount of urine, which may be signs of hemolytic uremic syndrome Sudden and severe headache, confusion, change in vision, seizures, which may be signs of posterior reversible encephalopathy syndrome (PRES) TTP--purple spots on the skin or inside the mouth, pale skin, yellowing skin or eyes, unusual weakness or fatigue, fever, fast or irregular heartbeat, confusion, change in vision, trouble speaking, trouble walking Tumor lysis syndrome (TLS)--nausea, vomiting, diarrhea, decrease in the amount of urine, dark urine, unusual weakness or fatigue, confusion, muscle pain or cramps, fast or irregular heartbeat, joint pain Side effects that usually do not require medical attention (report to your care team if they continue or are bothersome): Constipation Diarrhea Fatigue Loss of appetite Nausea This list may not describe all  possible side effects. Call your doctor for medical advice about side effects. You may report side effects to FDA at 1-800-FDA-1088. Where should I keep my medication? This medication is given in a hospital or clinic. It will not be stored at home. NOTE: This sheet is a summary. It may not cover all possible information. If you have questions about this medicine, talk to your doctor, pharmacist, or health care provider.  2024 Elsevier/Gold Standard (2022-04-22 00:00:00)Daratumumab; Hyaluronidase Injection What is this medication? DARATUMUMAB; HYALURONIDASE (dar a toom ue mab; hye al ur ON i dase) treats multiple myeloma, a type of bone marrow cancer. Daratumumab works by blocking a protein that causes cancer cells to grow and multiply. This helps to slow or stop the spread of cancer cells. Hyaluronidase works by increasing the absorption of other medications in the body to help them work better. This medication may also be used treat amyloidosis, a condition that causes the buildup of a protein (amyloid) in your body. It works by reducing the buildup of this protein, which decreases symptoms. It is a combination medication that contains a monoclonal antibody. This medicine may be used for other purposes; ask your health care provider or pharmacist if you have questions. COMMON BRAND NAME(S): DARZALEX FASPRO What should I tell my care team before I take this medication? They need to know if you have any of these conditions: Heart disease Infection, such as chickenpox, cold sores, herpes, hepatitis B Lung or breathing disease  An unusual or allergic reaction to daratumumab, hyaluronidase, other medications, foods, dyes, or preservatives Pregnant or trying to get pregnant Breast-feeding How should I use this medication? This medication is injected under the skin. It is given by your care team in a hospital or clinic setting. Talk to your care team about the use of this medication in children. Special  care may be needed. Overdosage: If you think you have taken too much of this medicine contact a poison control center or emergency room at once. NOTE: This medicine is only for you. Do not share this medicine with others. What if I miss a dose? Keep appointments for follow-up doses. It is important not to miss your dose. Call your care team if you are unable to keep an appointment. What may interact with this medication? Interactions have not been studied. This list may not describe all possible interactions. Give your health care provider a list of all the medicines, herbs, non-prescription drugs, or dietary supplements you use. Also tell them if you smoke, drink alcohol, or use illegal drugs. Some items may interact with your medicine. What should I watch for while using this medication? Your condition will be monitored carefully while you are receiving this medication. This medication can cause serious allergic reactions. To reduce your risk, your care team may give you other medication to take before receiving this one. Be sure to follow the directions from your care team. This medication can affect the results of blood tests to match your blood type. These changes can last for up to 6 months after the final dose. Your care team will do blood tests to match your blood type before you start treatment. Tell all of your care team that you are being treated with this medication before receiving a blood transfusion. This medication can affect the results of some tests used to determine treatment response; extra tests may be needed to evaluate response. Talk to your care team if you wish to become pregnant or think you are pregnant. This medication can cause serious birth defects if taken during pregnancy and for 3 months after the last dose. A reliable form of contraception is recommended while taking this medication and for 3 months after the last dose. Talk to your care team about effective forms of  contraception. Do not breast-feed while taking this medication. What side effects may I notice from receiving this medication? Side effects that you should report to your care team as soon as possible: Allergic reactions--skin rash, itching, hives, swelling of the face, lips, tongue, or throat Heart rhythm changes--fast or irregular heartbeat, dizziness, feeling faint or lightheaded, chest pain, trouble breathing Infection--fever, chills, cough, sore throat, wounds that don't heal, pain or trouble when passing urine, general feeling of discomfort or being unwell Infusion reactions--chest pain, shortness of breath or trouble breathing, feeling faint or lightheaded Sudden eye pain or change in vision such as blurry vision, seeing halos around lights, vision loss Unusual bruising or bleeding Side effects that usually do not require medical attention (report to your care team if they continue or are bothersome): Constipation Diarrhea Fatigue Nausea Pain, tingling, or numbness in the hands or feet Swelling of the ankles, hands, or feet This list may not describe all possible side effects. Call your doctor for medical advice about side effects. You may report side effects to FDA at 1-800-FDA-1088. Where should I keep my medication? This medication is given in a hospital or clinic. It will not be stored at home. NOTE:  This sheet is a summary. It may not cover all possible information. If you have questions about this medicine, talk to your doctor, pharmacist, or health care provider.  2024 Elsevier/Gold Standard (2022-03-25 00:00:00)

## 2024-12-10 ENCOUNTER — Other Ambulatory Visit: Payer: Self-pay

## 2024-12-10 LAB — IGG, IGA, IGM
IgA: 151 mg/dL (ref 61–437)
IgG (Immunoglobin G), Serum: 1277 mg/dL (ref 603–1613)
IgM (Immunoglobulin M), Srm: 14 mg/dL — ABNORMAL LOW (ref 15–143)

## 2024-12-11 ENCOUNTER — Other Ambulatory Visit: Payer: Self-pay

## 2024-12-12 ENCOUNTER — Telehealth: Payer: Self-pay

## 2024-12-12 LAB — KAPPA/LAMBDA LIGHT CHAINS
Kappa free light chain: 264.5 mg/L — ABNORMAL HIGH (ref 3.3–19.4)
Kappa, lambda light chain ratio: 2.5 — ABNORMAL HIGH (ref 0.26–1.65)
Lambda free light chains: 105.9 mg/L — ABNORMAL HIGH (ref 5.7–26.3)

## 2024-12-12 NOTE — Telephone Encounter (Signed)
 RN called patient to complete chemotherapy follow up call. Patient received C1D1 velcada/daratumumab  SQ on 12/09/24 and denies experiencing any side effects. Patient specifically denies any nausea, vomiting, or diarrhea and is having no issues with eating or drinking. Patient confirmed upcoming appointment on 12/16/24 and verbalized understanding to contact us  should side effects or needs arise prior to this appointment.

## 2024-12-13 LAB — PROTEIN ELECTROPHORESIS, SERUM, WITH REFLEX
A/G Ratio: 1.4 (ref 0.7–1.7)
Albumin ELP: 4.4 g/dL (ref 2.9–4.4)
Alpha-1-Globulin: 0.2 g/dL (ref 0.0–0.4)
Alpha-2-Globulin: 0.7 g/dL (ref 0.4–1.0)
Beta Globulin: 0.9 g/dL (ref 0.7–1.3)
Gamma Globulin: 1.2 g/dL (ref 0.4–1.8)
Globulin, Total: 3.1 g/dL (ref 2.2–3.9)
Total Protein ELP: 7.5 g/dL (ref 6.0–8.5)

## 2024-12-16 ENCOUNTER — Other Ambulatory Visit: Payer: Self-pay | Admitting: *Deleted

## 2024-12-16 ENCOUNTER — Inpatient Hospital Stay

## 2024-12-16 ENCOUNTER — Other Ambulatory Visit: Payer: Self-pay | Admitting: Hematology & Oncology

## 2024-12-16 VITALS — BP 109/59 | HR 63 | Temp 98.0°F | Resp 18

## 2024-12-16 DIAGNOSIS — C9000 Multiple myeloma not having achieved remission: Secondary | ICD-10-CM

## 2024-12-16 DIAGNOSIS — Z5112 Encounter for antineoplastic immunotherapy: Secondary | ICD-10-CM | POA: Diagnosis not present

## 2024-12-16 LAB — CBC WITH DIFFERENTIAL (CANCER CENTER ONLY)
Abs Immature Granulocytes: 0 K/uL (ref 0.00–0.07)
Basophils Absolute: 0 K/uL (ref 0.0–0.1)
Basophils Relative: 1 %
Eosinophils Absolute: 0 K/uL (ref 0.0–0.5)
Eosinophils Relative: 0 %
HCT: 36.7 % — ABNORMAL LOW (ref 39.0–52.0)
Hemoglobin: 11.8 g/dL — ABNORMAL LOW (ref 13.0–17.0)
Immature Granulocytes: 0 %
Lymphocytes Relative: 21 %
Lymphs Abs: 0.6 K/uL — ABNORMAL LOW (ref 0.7–4.0)
MCH: 29.5 pg (ref 26.0–34.0)
MCHC: 32.2 g/dL (ref 30.0–36.0)
MCV: 91.8 fL (ref 80.0–100.0)
Monocytes Absolute: 0.7 K/uL (ref 0.1–1.0)
Monocytes Relative: 23 %
Neutro Abs: 1.6 K/uL — ABNORMAL LOW (ref 1.7–7.7)
Neutrophils Relative %: 55 %
Platelet Count: 152 K/uL (ref 150–400)
RBC: 4 MIL/uL — ABNORMAL LOW (ref 4.22–5.81)
RDW: 15.3 % (ref 11.5–15.5)
WBC Count: 2.9 K/uL — ABNORMAL LOW (ref 4.0–10.5)
nRBC: 0 % (ref 0.0–0.2)

## 2024-12-16 LAB — CMP (CANCER CENTER ONLY)
ALT: 17 U/L (ref 0–44)
AST: 22 U/L (ref 15–41)
Albumin: 4.7 g/dL (ref 3.5–5.0)
Alkaline Phosphatase: 75 U/L (ref 38–126)
Anion gap: 14 (ref 5–15)
BUN: 38 mg/dL — ABNORMAL HIGH (ref 8–23)
CO2: 31 mmol/L (ref 22–32)
Calcium: 9.1 mg/dL (ref 8.9–10.3)
Chloride: 95 mmol/L — ABNORMAL LOW (ref 98–111)
Creatinine: 7.5 mg/dL (ref 0.61–1.24)
GFR, Estimated: 7 mL/min — ABNORMAL LOW
Glucose, Bld: 109 mg/dL — ABNORMAL HIGH (ref 70–99)
Potassium: 4.5 mmol/L (ref 3.5–5.1)
Sodium: 140 mmol/L (ref 135–145)
Total Bilirubin: 0.4 mg/dL (ref 0.0–1.2)
Total Protein: 7.4 g/dL (ref 6.5–8.1)

## 2024-12-16 MED ORDER — BORTEZOMIB CHEMO SQ INJECTION 3.5 MG (2.5MG/ML)
1.3000 mg/m2 | Freq: Once | INTRAMUSCULAR | Status: AC
  Administered 2024-12-16: 2.25 mg via SUBCUTANEOUS
  Filled 2024-12-16: qty 0.9

## 2024-12-16 MED ORDER — DARATUMUMAB-HYALURONIDASE-FIHJ 1800-30000 MG-UT/15ML ~~LOC~~ SOLN
1800.0000 mg | Freq: Once | SUBCUTANEOUS | Status: AC
  Administered 2024-12-16: 1800 mg via SUBCUTANEOUS
  Filled 2024-12-16: qty 15

## 2024-12-16 MED ORDER — DIPHENHYDRAMINE HCL 25 MG PO CAPS
50.0000 mg | ORAL_CAPSULE | Freq: Once | ORAL | Status: AC
  Administered 2024-12-16: 50 mg via ORAL
  Filled 2024-12-16: qty 2

## 2024-12-16 MED ORDER — FAMCICLOVIR 250 MG PO TABS: 125.0000 mg | ORAL_TABLET | Freq: Every day | ORAL | 1 refills | Status: AC

## 2024-12-16 MED ORDER — ACETAMINOPHEN 325 MG PO TABS
650.0000 mg | ORAL_TABLET | Freq: Once | ORAL | Status: AC
  Administered 2024-12-16: 650 mg via ORAL
  Filled 2024-12-16: qty 2

## 2024-12-16 MED ORDER — DEXAMETHASONE 4 MG PO TABS
20.0000 mg | ORAL_TABLET | Freq: Once | ORAL | Status: AC
  Administered 2024-12-16: 20 mg via ORAL
  Filled 2024-12-16: qty 5

## 2024-12-16 MED ORDER — MONTELUKAST SODIUM 10 MG PO TABS
10.0000 mg | ORAL_TABLET | Freq: Once | ORAL | Status: AC
  Administered 2024-12-16: 10 mg via ORAL
  Filled 2024-12-16: qty 1

## 2024-12-16 NOTE — Patient Instructions (Signed)
 Bortezomib Injection What is this medication? BORTEZOMIB (bor TEZ oh mib) treats lymphoma. It may also be used to treat multiple myeloma, a type of bone marrow cancer. It works by blocking a protein that causes cancer cells to grow and multiply. This helps to slow or stop the spread of cancer cells. This medicine may be used for other purposes; ask your health care provider or pharmacist if you have questions. COMMON BRAND NAME(S): BORUZU, Velcade What should I tell my care team before I take this medication? They need to know if you have any of these conditions: Dehydration Diabetes Heart disease Liver disease Tingling of the fingers or toes or other nerve disorder An unusual or allergic reaction to bortezomib, other medications, foods, dyes, or preservatives If you or your partner are pregnant or trying to get pregnant Breastfeeding How should I use this medication? This medication is injected into a vein or under the skin. It is given by your care team in a hospital or clinic setting. Talk to your care team about the use of this medication in children. Special care may be needed. Overdosage: If you think you have taken too much of this medicine contact a poison control center or emergency room at once. NOTE: This medicine is only for you. Do not share this medicine with others. What if I miss a dose? Keep appointments for follow-up doses. It is important not to miss your dose. Call your care team if you are unable to keep an appointment. What may interact with this medication? Ketoconazole Rifampin This list may not describe all possible interactions. Give your health care provider a list of all the medicines, herbs, non-prescription drugs, or dietary supplements you use. Also tell them if you smoke, drink alcohol, or use illegal drugs. Some items may interact with your medicine. What should I watch for while using this medication? Your condition will be monitored carefully while you  are receiving this medication. You may need blood work while taking this medication. This medication may affect your coordination, reaction time, or judgment. Do not drive or operate machinery until you know how this medication affects you. Sit up or stand slowly to reduce the risk of dizzy or fainting spells. Drinking alcohol with this medication can increase the risk of these side effects. This medication may increase your risk of getting an infection. Call your care team for advice if you get a fever, chills, sore throat, or other symptoms of a cold or flu. Do not treat yourself. Try to avoid being around people who are sick. Check with your care team if you have severe diarrhea, nausea, and vomiting, or if you sweat a lot. The loss of too much body fluid may make it dangerous for you to take this medication. Talk to your care team if you may be pregnant. Serious birth defects can occur if you take this medication during pregnancy and for 7 months after the last dose. You will need a negative pregnancy test before starting this medication. Contraception is recommended while taking this medication and for 7 months after the last dose. Your care team can help you find the option that works for you. If your partner can get pregnant, use a condom during sex while taking this medication and for 4 months after the last dose. Do not breastfeed while taking this medication and for 2 months after the last dose. This medication may cause infertility. Talk to your care team if you are concerned about your fertility. What side  effects may I notice from receiving this medication? Side effects that you should report to your care team as soon as possible: Allergic reactions--skin rash, itching, hives, swelling of the face, lips, tongue, or throat Bleeding--bloody or black, tar-like stools, vomiting blood or brown material that looks like coffee grounds, red or dark brown urine, small red or purple spots on skin,  unusual bruising or bleeding Bleeding in the brain--severe headache, stiff neck, confusion, dizziness, change in vision, numbness or weakness of the face, arm, or leg, trouble speaking, trouble walking, vomiting Bowel blockage--stomach cramping, unable to have a bowel movement or pass gas, loss of appetite, vomiting Heart failure--shortness of breath, swelling of the ankles, feet, or hands, sudden weight gain, unusual weakness or fatigue Infection--fever, chills, cough, sore throat, wounds that don't heal, pain or trouble when passing urine, general feeling of discomfort or being unwell Liver injury--right upper belly pain, loss of appetite, nausea, light-colored stool, dark yellow or brown urine, yellowing skin or eyes, unusual weakness or fatigue Low blood pressure--dizziness, feeling faint or lightheaded, blurry vision Lung injury--shortness of breath or trouble breathing, cough, spitting up blood, chest pain, fever Pain, tingling, or numbness in the hands or feet Severe or prolonged diarrhea Stomach pain, bloody diarrhea, pale skin, unusual weakness or fatigue, decrease in the amount of urine, which may be signs of hemolytic uremic syndrome Sudden and severe headache, confusion, change in vision, seizures, which may be signs of posterior reversible encephalopathy syndrome (PRES) TTP--purple spots on the skin or inside the mouth, pale skin, yellowing skin or eyes, unusual weakness or fatigue, fever, fast or irregular heartbeat, confusion, change in vision, trouble speaking, trouble walking Tumor lysis syndrome (TLS)--nausea, vomiting, diarrhea, decrease in the amount of urine, dark urine, unusual weakness or fatigue, confusion, muscle pain or cramps, fast or irregular heartbeat, joint pain Side effects that usually do not require medical attention (report to your care team if they continue or are bothersome): Constipation Diarrhea Fatigue Loss of appetite Nausea This list may not describe all  possible side effects. Call your doctor for medical advice about side effects. You may report side effects to FDA at 1-800-FDA-1088. Where should I keep my medication? This medication is given in a hospital or clinic. It will not be stored at home. NOTE: This sheet is a summary. It may not cover all possible information. If you have questions about this medicine, talk to your doctor, pharmacist, or health care provider.  2024 Elsevier/Gold Standard (2022-04-22 00:00:00)Daratumumab; Hyaluronidase Injection What is this medication? DARATUMUMAB; HYALURONIDASE (dar a toom ue mab; hye al ur ON i dase) treats multiple myeloma, a type of bone marrow cancer. Daratumumab works by blocking a protein that causes cancer cells to grow and multiply. This helps to slow or stop the spread of cancer cells. Hyaluronidase works by increasing the absorption of other medications in the body to help them work better. This medication may also be used treat amyloidosis, a condition that causes the buildup of a protein (amyloid) in your body. It works by reducing the buildup of this protein, which decreases symptoms. It is a combination medication that contains a monoclonal antibody. This medicine may be used for other purposes; ask your health care provider or pharmacist if you have questions. COMMON BRAND NAME(S): DARZALEX FASPRO What should I tell my care team before I take this medication? They need to know if you have any of these conditions: Heart disease Infection, such as chickenpox, cold sores, herpes, hepatitis B Lung or breathing disease  An unusual or allergic reaction to daratumumab, hyaluronidase, other medications, foods, dyes, or preservatives Pregnant or trying to get pregnant Breast-feeding How should I use this medication? This medication is injected under the skin. It is given by your care team in a hospital or clinic setting. Talk to your care team about the use of this medication in children. Special  care may be needed. Overdosage: If you think you have taken too much of this medicine contact a poison control center or emergency room at once. NOTE: This medicine is only for you. Do not share this medicine with others. What if I miss a dose? Keep appointments for follow-up doses. It is important not to miss your dose. Call your care team if you are unable to keep an appointment. What may interact with this medication? Interactions have not been studied. This list may not describe all possible interactions. Give your health care provider a list of all the medicines, herbs, non-prescription drugs, or dietary supplements you use. Also tell them if you smoke, drink alcohol, or use illegal drugs. Some items may interact with your medicine. What should I watch for while using this medication? Your condition will be monitored carefully while you are receiving this medication. This medication can cause serious allergic reactions. To reduce your risk, your care team may give you other medication to take before receiving this one. Be sure to follow the directions from your care team. This medication can affect the results of blood tests to match your blood type. These changes can last for up to 6 months after the final dose. Your care team will do blood tests to match your blood type before you start treatment. Tell all of your care team that you are being treated with this medication before receiving a blood transfusion. This medication can affect the results of some tests used to determine treatment response; extra tests may be needed to evaluate response. Talk to your care team if you wish to become pregnant or think you are pregnant. This medication can cause serious birth defects if taken during pregnancy and for 3 months after the last dose. A reliable form of contraception is recommended while taking this medication and for 3 months after the last dose. Talk to your care team about effective forms of  contraception. Do not breast-feed while taking this medication. What side effects may I notice from receiving this medication? Side effects that you should report to your care team as soon as possible: Allergic reactions--skin rash, itching, hives, swelling of the face, lips, tongue, or throat Heart rhythm changes--fast or irregular heartbeat, dizziness, feeling faint or lightheaded, chest pain, trouble breathing Infection--fever, chills, cough, sore throat, wounds that don't heal, pain or trouble when passing urine, general feeling of discomfort or being unwell Infusion reactions--chest pain, shortness of breath or trouble breathing, feeling faint or lightheaded Sudden eye pain or change in vision such as blurry vision, seeing halos around lights, vision loss Unusual bruising or bleeding Side effects that usually do not require medical attention (report to your care team if they continue or are bothersome): Constipation Diarrhea Fatigue Nausea Pain, tingling, or numbness in the hands or feet Swelling of the ankles, hands, or feet This list may not describe all possible side effects. Call your doctor for medical advice about side effects. You may report side effects to FDA at 1-800-FDA-1088. Where should I keep my medication? This medication is given in a hospital or clinic. It will not be stored at home. NOTE:  This sheet is a summary. It may not cover all possible information. If you have questions about this medicine, talk to your doctor, pharmacist, or health care provider.  2024 Elsevier/Gold Standard (2022-03-25 00:00:00)

## 2024-12-16 NOTE — Progress Notes (Signed)
 OK to treat with creat-7.5 per order of Dr. Timmy.

## 2024-12-16 NOTE — Telephone Encounter (Signed)
 Dr. Timmy notified of creat-7.5.  No new orders received at this time.

## 2024-12-23 ENCOUNTER — Inpatient Hospital Stay

## 2024-12-23 VITALS — BP 113/54 | HR 54 | Temp 98.7°F | Resp 20

## 2024-12-23 DIAGNOSIS — C9000 Multiple myeloma not having achieved remission: Secondary | ICD-10-CM

## 2024-12-23 DIAGNOSIS — Z5112 Encounter for antineoplastic immunotherapy: Secondary | ICD-10-CM | POA: Diagnosis not present

## 2024-12-23 LAB — CMP (CANCER CENTER ONLY)
ALT: 16 U/L (ref 0–44)
AST: 22 U/L (ref 15–41)
Albumin: 4.6 g/dL (ref 3.5–5.0)
Alkaline Phosphatase: 73 U/L (ref 38–126)
Anion gap: 14 (ref 5–15)
BUN: 36 mg/dL — ABNORMAL HIGH (ref 8–23)
CO2: 29 mmol/L (ref 22–32)
Calcium: 8.8 mg/dL — ABNORMAL LOW (ref 8.9–10.3)
Chloride: 96 mmol/L — ABNORMAL LOW (ref 98–111)
Creatinine: 7.24 mg/dL (ref 0.61–1.24)
GFR, Estimated: 7 mL/min — ABNORMAL LOW
Glucose, Bld: 112 mg/dL — ABNORMAL HIGH (ref 70–99)
Potassium: 4.7 mmol/L (ref 3.5–5.1)
Sodium: 139 mmol/L (ref 135–145)
Total Bilirubin: 0.3 mg/dL (ref 0.0–1.2)
Total Protein: 7.1 g/dL (ref 6.5–8.1)

## 2024-12-23 LAB — CBC WITH DIFFERENTIAL (CANCER CENTER ONLY)
Abs Immature Granulocytes: 0 K/uL (ref 0.00–0.07)
Basophils Absolute: 0 K/uL (ref 0.0–0.1)
Basophils Relative: 0 %
Eosinophils Absolute: 0 K/uL (ref 0.0–0.5)
Eosinophils Relative: 0 %
HCT: 34.9 % — ABNORMAL LOW (ref 39.0–52.0)
Hemoglobin: 11.3 g/dL — ABNORMAL LOW (ref 13.0–17.0)
Immature Granulocytes: 0 %
Lymphocytes Relative: 16 %
Lymphs Abs: 0.6 K/uL — ABNORMAL LOW (ref 0.7–4.0)
MCH: 29.6 pg (ref 26.0–34.0)
MCHC: 32.4 g/dL (ref 30.0–36.0)
MCV: 91.4 fL (ref 80.0–100.0)
Monocytes Absolute: 0.6 K/uL (ref 0.1–1.0)
Monocytes Relative: 18 %
Neutro Abs: 2.4 K/uL (ref 1.7–7.7)
Neutrophils Relative %: 66 %
Platelet Count: 153 K/uL (ref 150–400)
RBC: 3.82 MIL/uL — ABNORMAL LOW (ref 4.22–5.81)
RDW: 15.5 % (ref 11.5–15.5)
WBC Count: 3.6 K/uL — ABNORMAL LOW (ref 4.0–10.5)
nRBC: 0 % (ref 0.0–0.2)

## 2024-12-23 MED ORDER — DEXAMETHASONE 4 MG PO TABS
20.0000 mg | ORAL_TABLET | Freq: Once | ORAL | Status: AC
  Administered 2024-12-23: 20 mg via ORAL
  Filled 2024-12-23: qty 5

## 2024-12-23 MED ORDER — DARATUMUMAB-HYALURONIDASE-FIHJ 1800-30000 MG-UT/15ML ~~LOC~~ SOLN
1800.0000 mg | Freq: Once | SUBCUTANEOUS | Status: AC
  Administered 2024-12-23: 1800 mg via SUBCUTANEOUS
  Filled 2024-12-23: qty 15

## 2024-12-23 MED ORDER — DIPHENHYDRAMINE HCL 25 MG PO CAPS
50.0000 mg | ORAL_CAPSULE | Freq: Once | ORAL | Status: AC
  Administered 2024-12-23: 50 mg via ORAL
  Filled 2024-12-23: qty 2

## 2024-12-23 MED ORDER — BORTEZOMIB CHEMO SQ INJECTION 3.5 MG (2.5MG/ML)
1.3000 mg/m2 | Freq: Once | INTRAMUSCULAR | Status: AC
  Administered 2024-12-23: 2.25 mg via SUBCUTANEOUS
  Filled 2024-12-23: qty 0.9

## 2024-12-23 MED ORDER — ACETAMINOPHEN 325 MG PO TABS
650.0000 mg | ORAL_TABLET | Freq: Once | ORAL | Status: AC
  Administered 2024-12-23: 650 mg via ORAL
  Filled 2024-12-23: qty 2

## 2024-12-23 MED ORDER — MONTELUKAST SODIUM 10 MG PO TABS
10.0000 mg | ORAL_TABLET | Freq: Once | ORAL | Status: AC
  Administered 2024-12-23: 10 mg via ORAL
  Filled 2024-12-23: qty 1

## 2024-12-23 NOTE — Patient Instructions (Signed)
 Bortezomib Injection What is this medication? BORTEZOMIB (bor TEZ oh mib) treats lymphoma. It may also be used to treat multiple myeloma, a type of bone marrow cancer. It works by blocking a protein that causes cancer cells to grow and multiply. This helps to slow or stop the spread of cancer cells. This medicine may be used for other purposes; ask your health care provider or pharmacist if you have questions. COMMON BRAND NAME(S): BORUZU, Velcade What should I tell my care team before I take this medication? They need to know if you have any of these conditions: Dehydration Diabetes Heart disease Liver disease Tingling of the fingers or toes or other nerve disorder An unusual or allergic reaction to bortezomib, other medications, foods, dyes, or preservatives If you or your partner are pregnant or trying to get pregnant Breastfeeding How should I use this medication? This medication is injected into a vein or under the skin. It is given by your care team in a hospital or clinic setting. Talk to your care team about the use of this medication in children. Special care may be needed. Overdosage: If you think you have taken too much of this medicine contact a poison control center or emergency room at once. NOTE: This medicine is only for you. Do not share this medicine with others. What if I miss a dose? Keep appointments for follow-up doses. It is important not to miss your dose. Call your care team if you are unable to keep an appointment. What may interact with this medication? Ketoconazole Rifampin This list may not describe all possible interactions. Give your health care provider a list of all the medicines, herbs, non-prescription drugs, or dietary supplements you use. Also tell them if you smoke, drink alcohol, or use illegal drugs. Some items may interact with your medicine. What should I watch for while using this medication? Your condition will be monitored carefully while you  are receiving this medication. You may need blood work while taking this medication. This medication may affect your coordination, reaction time, or judgment. Do not drive or operate machinery until you know how this medication affects you. Sit up or stand slowly to reduce the risk of dizzy or fainting spells. Drinking alcohol with this medication can increase the risk of these side effects. This medication may increase your risk of getting an infection. Call your care team for advice if you get a fever, chills, sore throat, or other symptoms of a cold or flu. Do not treat yourself. Try to avoid being around people who are sick. Check with your care team if you have severe diarrhea, nausea, and vomiting, or if you sweat a lot. The loss of too much body fluid may make it dangerous for you to take this medication. Talk to your care team if you may be pregnant. Serious birth defects can occur if you take this medication during pregnancy and for 7 months after the last dose. You will need a negative pregnancy test before starting this medication. Contraception is recommended while taking this medication and for 7 months after the last dose. Your care team can help you find the option that works for you. If your partner can get pregnant, use a condom during sex while taking this medication and for 4 months after the last dose. Do not breastfeed while taking this medication and for 2 months after the last dose. This medication may cause infertility. Talk to your care team if you are concerned about your fertility. What side  effects may I notice from receiving this medication? Side effects that you should report to your care team as soon as possible: Allergic reactions--skin rash, itching, hives, swelling of the face, lips, tongue, or throat Bleeding--bloody or black, tar-like stools, vomiting blood or brown material that looks like coffee grounds, red or dark brown urine, small red or purple spots on skin,  unusual bruising or bleeding Bleeding in the brain--severe headache, stiff neck, confusion, dizziness, change in vision, numbness or weakness of the face, arm, or leg, trouble speaking, trouble walking, vomiting Bowel blockage--stomach cramping, unable to have a bowel movement or pass gas, loss of appetite, vomiting Heart failure--shortness of breath, swelling of the ankles, feet, or hands, sudden weight gain, unusual weakness or fatigue Infection--fever, chills, cough, sore throat, wounds that don't heal, pain or trouble when passing urine, general feeling of discomfort or being unwell Liver injury--right upper belly pain, loss of appetite, nausea, light-colored stool, dark yellow or brown urine, yellowing skin or eyes, unusual weakness or fatigue Low blood pressure--dizziness, feeling faint or lightheaded, blurry vision Lung injury--shortness of breath or trouble breathing, cough, spitting up blood, chest pain, fever Pain, tingling, or numbness in the hands or feet Severe or prolonged diarrhea Stomach pain, bloody diarrhea, pale skin, unusual weakness or fatigue, decrease in the amount of urine, which may be signs of hemolytic uremic syndrome Sudden and severe headache, confusion, change in vision, seizures, which may be signs of posterior reversible encephalopathy syndrome (PRES) TTP--purple spots on the skin or inside the mouth, pale skin, yellowing skin or eyes, unusual weakness or fatigue, fever, fast or irregular heartbeat, confusion, change in vision, trouble speaking, trouble walking Tumor lysis syndrome (TLS)--nausea, vomiting, diarrhea, decrease in the amount of urine, dark urine, unusual weakness or fatigue, confusion, muscle pain or cramps, fast or irregular heartbeat, joint pain Side effects that usually do not require medical attention (report to your care team if they continue or are bothersome): Constipation Diarrhea Fatigue Loss of appetite Nausea This list may not describe all  possible side effects. Call your doctor for medical advice about side effects. You may report side effects to FDA at 1-800-FDA-1088. Where should I keep my medication? This medication is given in a hospital or clinic. It will not be stored at home. NOTE: This sheet is a summary. It may not cover all possible information. If you have questions about this medicine, talk to your doctor, pharmacist, or health care provider.  2024 Elsevier/Gold Standard (2022-04-22 00:00:00)Daratumumab; Hyaluronidase Injection What is this medication? DARATUMUMAB; HYALURONIDASE (dar a toom ue mab; hye al ur ON i dase) treats multiple myeloma, a type of bone marrow cancer. Daratumumab works by blocking a protein that causes cancer cells to grow and multiply. This helps to slow or stop the spread of cancer cells. Hyaluronidase works by increasing the absorption of other medications in the body to help them work better. This medication may also be used treat amyloidosis, a condition that causes the buildup of a protein (amyloid) in your body. It works by reducing the buildup of this protein, which decreases symptoms. It is a combination medication that contains a monoclonal antibody. This medicine may be used for other purposes; ask your health care provider or pharmacist if you have questions. COMMON BRAND NAME(S): DARZALEX FASPRO What should I tell my care team before I take this medication? They need to know if you have any of these conditions: Heart disease Infection, such as chickenpox, cold sores, herpes, hepatitis B Lung or breathing disease  An unusual or allergic reaction to daratumumab, hyaluronidase, other medications, foods, dyes, or preservatives Pregnant or trying to get pregnant Breast-feeding How should I use this medication? This medication is injected under the skin. It is given by your care team in a hospital or clinic setting. Talk to your care team about the use of this medication in children. Special  care may be needed. Overdosage: If you think you have taken too much of this medicine contact a poison control center or emergency room at once. NOTE: This medicine is only for you. Do not share this medicine with others. What if I miss a dose? Keep appointments for follow-up doses. It is important not to miss your dose. Call your care team if you are unable to keep an appointment. What may interact with this medication? Interactions have not been studied. This list may not describe all possible interactions. Give your health care provider a list of all the medicines, herbs, non-prescription drugs, or dietary supplements you use. Also tell them if you smoke, drink alcohol, or use illegal drugs. Some items may interact with your medicine. What should I watch for while using this medication? Your condition will be monitored carefully while you are receiving this medication. This medication can cause serious allergic reactions. To reduce your risk, your care team may give you other medication to take before receiving this one. Be sure to follow the directions from your care team. This medication can affect the results of blood tests to match your blood type. These changes can last for up to 6 months after the final dose. Your care team will do blood tests to match your blood type before you start treatment. Tell all of your care team that you are being treated with this medication before receiving a blood transfusion. This medication can affect the results of some tests used to determine treatment response; extra tests may be needed to evaluate response. Talk to your care team if you wish to become pregnant or think you are pregnant. This medication can cause serious birth defects if taken during pregnancy and for 3 months after the last dose. A reliable form of contraception is recommended while taking this medication and for 3 months after the last dose. Talk to your care team about effective forms of  contraception. Do not breast-feed while taking this medication. What side effects may I notice from receiving this medication? Side effects that you should report to your care team as soon as possible: Allergic reactions--skin rash, itching, hives, swelling of the face, lips, tongue, or throat Heart rhythm changes--fast or irregular heartbeat, dizziness, feeling faint or lightheaded, chest pain, trouble breathing Infection--fever, chills, cough, sore throat, wounds that don't heal, pain or trouble when passing urine, general feeling of discomfort or being unwell Infusion reactions--chest pain, shortness of breath or trouble breathing, feeling faint or lightheaded Sudden eye pain or change in vision such as blurry vision, seeing halos around lights, vision loss Unusual bruising or bleeding Side effects that usually do not require medical attention (report to your care team if they continue or are bothersome): Constipation Diarrhea Fatigue Nausea Pain, tingling, or numbness in the hands or feet Swelling of the ankles, hands, or feet This list may not describe all possible side effects. Call your doctor for medical advice about side effects. You may report side effects to FDA at 1-800-FDA-1088. Where should I keep my medication? This medication is given in a hospital or clinic. It will not be stored at home. NOTE:  This sheet is a summary. It may not cover all possible information. If you have questions about this medicine, talk to your doctor, pharmacist, or health care provider.  2024 Elsevier/Gold Standard (2022-03-25 00:00:00)

## 2024-12-23 NOTE — Progress Notes (Signed)
 Critical result received from lab of creatinine 7.24 Dr. Timmy aware and no new orders received. Ok to treat per Dr. Timmy despite creatinine.

## 2024-12-30 ENCOUNTER — Inpatient Hospital Stay

## 2024-12-30 VITALS — BP 130/60 | HR 56 | Temp 97.9°F | Resp 18

## 2024-12-30 DIAGNOSIS — C9000 Multiple myeloma not having achieved remission: Secondary | ICD-10-CM

## 2024-12-30 DIAGNOSIS — Z5112 Encounter for antineoplastic immunotherapy: Secondary | ICD-10-CM | POA: Diagnosis not present

## 2024-12-30 LAB — CBC WITH DIFFERENTIAL (CANCER CENTER ONLY)
Abs Immature Granulocytes: 0 10*3/uL (ref 0.00–0.07)
Basophils Absolute: 0 10*3/uL (ref 0.0–0.1)
Basophils Relative: 0 %
Eosinophils Absolute: 0 10*3/uL (ref 0.0–0.5)
Eosinophils Relative: 0 %
HCT: 36.6 % — ABNORMAL LOW (ref 39.0–52.0)
Hemoglobin: 11.8 g/dL — ABNORMAL LOW (ref 13.0–17.0)
Immature Granulocytes: 0 %
Lymphocytes Relative: 22 %
Lymphs Abs: 0.6 10*3/uL — ABNORMAL LOW (ref 0.7–4.0)
MCH: 28.9 pg (ref 26.0–34.0)
MCHC: 32.2 g/dL (ref 30.0–36.0)
MCV: 89.7 fL (ref 80.0–100.0)
Monocytes Absolute: 0.6 10*3/uL (ref 0.1–1.0)
Monocytes Relative: 21 %
Neutro Abs: 1.6 10*3/uL — ABNORMAL LOW (ref 1.7–7.7)
Neutrophils Relative %: 57 %
Platelet Count: 165 10*3/uL (ref 150–400)
RBC: 4.08 MIL/uL — ABNORMAL LOW (ref 4.22–5.81)
RDW: 15.7 % — ABNORMAL HIGH (ref 11.5–15.5)
WBC Count: 2.8 10*3/uL — ABNORMAL LOW (ref 4.0–10.5)
nRBC: 0 % (ref 0.0–0.2)

## 2024-12-30 LAB — CMP (CANCER CENTER ONLY)
ALT: 20 U/L (ref 0–44)
AST: 24 U/L (ref 15–41)
Albumin: 4.7 g/dL (ref 3.5–5.0)
Alkaline Phosphatase: 71 U/L (ref 38–126)
Anion gap: 16 — ABNORMAL HIGH (ref 5–15)
BUN: 34 mg/dL — ABNORMAL HIGH (ref 8–23)
CO2: 30 mmol/L (ref 22–32)
Calcium: 9.4 mg/dL (ref 8.9–10.3)
Chloride: 96 mmol/L — ABNORMAL LOW (ref 98–111)
Creatinine: 7.49 mg/dL (ref 0.61–1.24)
GFR, Estimated: 7 mL/min — ABNORMAL LOW
Glucose, Bld: 106 mg/dL — ABNORMAL HIGH (ref 70–99)
Potassium: 4.6 mmol/L (ref 3.5–5.1)
Sodium: 141 mmol/L (ref 135–145)
Total Bilirubin: 0.4 mg/dL (ref 0.0–1.2)
Total Protein: 7.2 g/dL (ref 6.5–8.1)

## 2024-12-30 MED ORDER — DIPHENHYDRAMINE HCL 25 MG PO CAPS
50.0000 mg | ORAL_CAPSULE | Freq: Once | ORAL | Status: AC
  Administered 2024-12-30: 50 mg via ORAL
  Filled 2024-12-30: qty 2

## 2024-12-30 MED ORDER — DEXAMETHASONE 4 MG PO TABS
20.0000 mg | ORAL_TABLET | Freq: Once | ORAL | Status: AC
  Administered 2024-12-30: 20 mg via ORAL
  Filled 2024-12-30: qty 5

## 2024-12-30 MED ORDER — DARATUMUMAB-HYALURONIDASE-FIHJ 1800-30000 MG-UT/15ML ~~LOC~~ SOLN
1800.0000 mg | Freq: Once | SUBCUTANEOUS | Status: AC
  Administered 2024-12-30: 1800 mg via SUBCUTANEOUS
  Filled 2024-12-30: qty 15

## 2024-12-30 MED ORDER — ACETAMINOPHEN 325 MG PO TABS
650.0000 mg | ORAL_TABLET | Freq: Once | ORAL | Status: AC
  Administered 2024-12-30: 650 mg via ORAL
  Filled 2024-12-30: qty 2

## 2024-12-30 NOTE — Progress Notes (Signed)
 Critical result received from lab of creatinine 7.49 Dr. Timmy aware and ok to treat despite result.

## 2024-12-30 NOTE — Patient Instructions (Signed)
 [facility] - A DEPT OF [facility]  Discharge Instructions: Thank you for choosing [facility] to provide your oncology and hematology care.   If you have a lab appointment with the Cancer Center, please go directly to the Cancer Center and check in at the registration area.  Wear comfortable clothing and clothing appropriate for easy access to any Portacath or PICC line.   We strive to give you quality time with your provider. You may need to reschedule your appointment if you arrive late (15 or more minutes).  Arriving late affects you and other patients whose appointments are after yours.  Also, if you miss three or more appointments without notifying the office, you may be dismissed from the clinic at the provider's discretion.      For prescription refill requests, have your pharmacy contact our office and allow 72 hours for refills to be completed.    Today you received the following chemotherapy and/or immunotherapy agents:  Darzalex/Faspro      To help prevent nausea and vomiting after your treatment, we encourage you to take your nausea medication as directed.  BELOW ARE SYMPTOMS THAT SHOULD BE REPORTED IMMEDIATELY: *FEVER GREATER THAN 100.4 F (38 C) OR HIGHER *CHILLS OR SWEATING *NAUSEA AND VOMITING THAT IS NOT CONTROLLED WITH YOUR NAUSEA MEDICATION *UNUSUAL SHORTNESS OF BREATH *UNUSUAL BRUISING OR BLEEDING *URINARY PROBLEMS (pain or burning when urinating, or frequent urination) *BOWEL PROBLEMS (unusual diarrhea, constipation, pain near the anus) TENDERNESS IN MOUTH AND THROAT WITH OR WITHOUT PRESENCE OF ULCERS (sore throat, sores in mouth, or a toothache) UNUSUAL RASH, SWELLING OR PAIN  UNUSUAL VAGINAL DISCHARGE OR ITCHING   Items with * indicate a potential emergency and should be followed up as soon as possible or go to the Emergency Department if any problems should occur.  Please show the CHEMOTHERAPY ALERT CARD or  IMMUNOTHERAPY ALERT CARD at check-in to the Emergency Department and triage nurse. Should you have questions after your visit or need to cancel or reschedule your appointment, please contact [facility] - A DEPT OF [facility]  [phone] and follow the prompts.  Office hours are 8:00 a.m. to 4:30 p.m. Monday - Friday. Please note that voicemails left after 4:00 p.m. may not be returned until the following business day.  We are closed weekends and major holidays. You have access to a nurse at all times for urgent questions. Please call the main number to the clinic [phone] and follow the prompts.  For any non-urgent questions, you may also contact your provider using MyChart. We now offer e-Visits for anyone 39 and older to request care online for non-urgent symptoms. For details visit [email or web address].   Also download the MyChart app! Go to the app store, search "MyChart", open the app, select [place], and log in with your MyChart username and password.

## 2024-12-30 NOTE — Progress Notes (Signed)
 Pt creatinine was 7.49, per Ennever MD continue with treatment.

## 2025-01-06 ENCOUNTER — Inpatient Hospital Stay: Admitting: Hematology & Oncology

## 2025-01-06 ENCOUNTER — Inpatient Hospital Stay: Attending: Hematology & Oncology

## 2025-01-06 ENCOUNTER — Inpatient Hospital Stay

## 2025-01-06 ENCOUNTER — Other Ambulatory Visit: Payer: Self-pay

## 2025-01-06 ENCOUNTER — Encounter: Payer: Self-pay | Admitting: Hematology & Oncology

## 2025-01-06 VITALS — BP 122/65 | HR 64 | Temp 98.4°F | Resp 18 | Ht 69.0 in | Wt 146.0 lb

## 2025-01-06 VITALS — BP 135/60 | HR 58 | Resp 20

## 2025-01-06 DIAGNOSIS — I129 Hypertensive chronic kidney disease with stage 1 through stage 4 chronic kidney disease, or unspecified chronic kidney disease: Secondary | ICD-10-CM

## 2025-01-06 DIAGNOSIS — C9000 Multiple myeloma not having achieved remission: Secondary | ICD-10-CM

## 2025-01-06 LAB — CBC WITH DIFFERENTIAL (CANCER CENTER ONLY)
Abs Immature Granulocytes: 0.01 10*3/uL (ref 0.00–0.07)
Basophils Absolute: 0 10*3/uL (ref 0.0–0.1)
Basophils Relative: 0 %
Eosinophils Absolute: 0 10*3/uL (ref 0.0–0.5)
Eosinophils Relative: 0 %
HCT: 34.8 % — ABNORMAL LOW (ref 39.0–52.0)
Hemoglobin: 11.4 g/dL — ABNORMAL LOW (ref 13.0–17.0)
Immature Granulocytes: 0 %
Lymphocytes Relative: 18 %
Lymphs Abs: 0.5 10*3/uL — ABNORMAL LOW (ref 0.7–4.0)
MCH: 29.5 pg (ref 26.0–34.0)
MCHC: 32.8 g/dL (ref 30.0–36.0)
MCV: 90.2 fL (ref 80.0–100.0)
Monocytes Absolute: 0.3 10*3/uL (ref 0.1–1.0)
Monocytes Relative: 12 %
Neutro Abs: 1.8 10*3/uL (ref 1.7–7.7)
Neutrophils Relative %: 70 %
Platelet Count: 186 10*3/uL (ref 150–400)
RBC: 3.86 MIL/uL — ABNORMAL LOW (ref 4.22–5.81)
RDW: 16 % — ABNORMAL HIGH (ref 11.5–15.5)
WBC Count: 2.6 10*3/uL — ABNORMAL LOW (ref 4.0–10.5)
nRBC: 0 % (ref 0.0–0.2)

## 2025-01-06 LAB — CMP (CANCER CENTER ONLY)
ALT: 22 U/L (ref 0–44)
AST: 26 U/L (ref 15–41)
Albumin: 4.8 g/dL (ref 3.5–5.0)
Alkaline Phosphatase: 79 U/L (ref 38–126)
Anion gap: 14 (ref 5–15)
BUN: 49 mg/dL — ABNORMAL HIGH (ref 8–23)
CO2: 25 mmol/L (ref 22–32)
Calcium: 9.5 mg/dL (ref 8.9–10.3)
Chloride: 101 mmol/L (ref 98–111)
Creatinine: 7.85 mg/dL (ref 0.61–1.24)
GFR, Estimated: 7 mL/min — ABNORMAL LOW
Glucose, Bld: 102 mg/dL — ABNORMAL HIGH (ref 70–99)
Potassium: 5.1 mmol/L (ref 3.5–5.1)
Sodium: 140 mmol/L (ref 135–145)
Total Bilirubin: 0.3 mg/dL (ref 0.0–1.2)
Total Protein: 7.3 g/dL (ref 6.5–8.1)

## 2025-01-06 LAB — LACTATE DEHYDROGENASE: LDH: 167 U/L (ref 105–235)

## 2025-01-06 MED ORDER — DIPHENHYDRAMINE HCL 25 MG PO CAPS
50.0000 mg | ORAL_CAPSULE | Freq: Once | ORAL | Status: AC
  Administered 2025-01-06: 50 mg via ORAL
  Filled 2025-01-06: qty 2

## 2025-01-06 MED ORDER — ACETAMINOPHEN 325 MG PO TABS
650.0000 mg | ORAL_TABLET | Freq: Once | ORAL | Status: AC
  Administered 2025-01-06: 650 mg via ORAL
  Filled 2025-01-06: qty 2

## 2025-01-06 MED ORDER — DEXAMETHASONE 4 MG PO TABS
20.0000 mg | ORAL_TABLET | Freq: Once | ORAL | Status: AC
  Administered 2025-01-06: 20 mg via ORAL
  Filled 2025-01-06: qty 5

## 2025-01-06 MED ORDER — BORTEZOMIB CHEMO SQ INJECTION 3.5 MG (2.5MG/ML)
1.3000 mg/m2 | Freq: Once | INTRAMUSCULAR | Status: AC
  Administered 2025-01-06: 2.25 mg via SUBCUTANEOUS
  Filled 2025-01-06: qty 0.9

## 2025-01-06 MED ORDER — DARATUMUMAB-HYALURONIDASE-FIHJ 1800-30000 MG-UT/15ML ~~LOC~~ SOLN
1800.0000 mg | Freq: Once | SUBCUTANEOUS | Status: AC
  Administered 2025-01-06: 1800 mg via SUBCUTANEOUS
  Filled 2025-01-06: qty 15

## 2025-01-06 NOTE — Progress Notes (Signed)
 " Hematology and Oncology Follow Up Visit  Alan Shah [id number] February 14, 1951 74 y.o. 01/06/2025   Principle Diagnosis:  IgG Kappa myeloma - ? Smoldering -- (-) cytogenetics/(-) FISH   Current Therapy:        Faspro/Velcade  - started 12/09/2024   Interim History:  Alan Shah is here today for follow-up and to start treatment with Faspro and Velcade . He is doing well and has no complaints at this time.   At last visit M-spike was not observed, IgG level was 1,277 mg/dL and Kappa light chains 26.5 mg/dL.   He is tolerating dialysis T, Th, Sat nicely with some occasional muscle cramping in the legs. This has been tolerable over all.   No swelling, numbness or tingling in his extremities.  No falls or syncope.  No fever, chills, n/v, cough, rash, dizziness, SOB, chest pain, palpitations, abdominal pain or changes in bowel or bladder habits.   Appetite is good and he is on 32 oz fluid restriction daily. Weight is 147 lbs.   ECOG Performance Status: 1 - Symptomatic but completely ambulatory  Medications:  Allergies as of 01/06/2025   No Known Allergies      Medication List        Accurate as of January 06, 2025  9:39 AM. If you have any questions, ask your nurse or doctor.          aspirin EC 81 MG tablet Take 81 mg by mouth in the morning.   famciclovir  250 MG tablet Commonly known as: FAMVIR  Take 0.5 tablets (125 mg total) by mouth daily.   hydrochlorothiazide  25 MG tablet Commonly known as: HYDRODIURIL  Take 25 mg by mouth in the morning.   latanoprost 0.005 % ophthalmic solution Commonly known as: XALATAN Place 1 drop into the left eye at bedtime.   MIRCERA IJ 30 mcg.   multivitamin tablet Take 1 tablet by mouth in the morning.   pravastatin  40 MG tablet Commonly known as: PRAVACHOL  TAKE 1 TABLET(40 MG) BY MOUTH EVERY MORNING   Velphoro 500 MG chewable tablet Generic drug: sucroferric oxyhydroxide 1 tablet 3 (three) times daily with meals.   verapamil  180  MG CR tablet Commonly known as: CALAN -SR Take 1 tablet (180 mg total) by mouth at bedtime.        Allergies: Allergies[1]  Past Medical History, Surgical history, Social history, and Family History were reviewed and updated.  Review of Systems: All other 10 point review of systems is negative.   Physical Exam:  height is 5' 9 (1.753 m) and weight is 146 lb (66.2 kg). His oral temperature is 98.4 F (36.9 C). His blood pressure is 122/65 and his pulse is 64. His respiration is 18 and oxygen saturation is 100%.   Wt Readings from Last 3 Encounters:  01/06/25 146 lb (66.2 kg)  12/09/24 147 lb (66.7 kg)  11/30/24 149 lb (67.6 kg)   Physical Exam Vitals reviewed.  HENT:     Head: Normocephalic and atraumatic.  Eyes:     Pupils: Pupils are equal, round, and reactive to light.  Cardiovascular:     Rate and Rhythm: Normal rate and regular rhythm.     Heart sounds: Normal heart sounds.  Pulmonary:     Effort: Pulmonary effort is normal.     Breath sounds: Normal breath sounds.  Abdominal:     General: Bowel sounds are normal.     Palpations: Abdomen is soft.  Musculoskeletal:        General: No tenderness  or deformity. Normal range of motion.     Cervical back: Normal range of motion.  Lymphadenopathy:     Cervical: No cervical adenopathy.  Skin:    General: Skin is warm and dry.     Findings: No erythema or rash.  Neurological:     Mental Status: He is alert and oriented to person, place, and time.  Psychiatric:        Behavior: Behavior normal.        Thought Content: Thought content normal.        Judgment: Judgment normal.      Lab Results  Component Value Date   WBC 2.6 (L) 01/06/2025   HGB 11.4 (L) 01/06/2025   HCT 34.8 (L) 01/06/2025   MCV 90.2 01/06/2025   PLT 186 01/06/2025   Lab Results  Component Value Date   FERRITIN 258 02/13/2016   Lab Results  Component Value Date   RETICCTPCT 0.74 (L) 02/13/2016   RBC 3.86 (L) 01/06/2025   RETICCTABS  28.93 (L) 02/13/2016   Lab Results  Component Value Date   KPAFRELGTCHN 264.5 (H) 12/09/2024   LAMBDASER 105.9 (H) 12/09/2024   KAPLAMBRATIO 2.50 (H) 12/09/2024   Lab Results  Component Value Date   IGGSERUM 1,277 12/09/2024   IGA 151 12/09/2024   IGMSERUM 14 (L) 12/09/2024   Lab Results  Component Value Date   TOTALPROTELP 7.5 12/09/2024   ALBUMINELP 4.4 12/09/2024   A1GS 0.2 12/09/2024   A2GS 0.7 12/09/2024   BETS 0.9 12/09/2024   BETA2SER 0.4 07/10/2016   GAMS 1.2 12/09/2024   MSPIKE Not Observed 12/09/2024   SPEI SEE NOTE 07/10/2016     Chemistry      Component Value Date/Time   NA 140 01/06/2025 0852   NA 144 04/19/2020 1113   NA 142 02/13/2016 1149   K 5.1 01/06/2025 0852   K 4.0 02/13/2016 1149   CL 101 01/06/2025 0852   CO2 25 01/06/2025 0852   CO2 26 02/13/2016 1149   BUN 49 (H) 01/06/2025 0852   BUN 48 (H) 04/19/2020 1113   BUN 30.6 (H) 02/13/2016 1149   CREATININE 7.85 (HH) 01/06/2025 0852   CREATININE 1.89 (H) 07/10/2016 1340   CREATININE 1.9 (H) 02/13/2016 1149      Component Value Date/Time   CALCIUM 9.5 01/06/2025 0852   CALCIUM 9.8 02/13/2016 1149   ALKPHOS 79 01/06/2025 0852   ALKPHOS 50 02/13/2016 1149   AST 26 01/06/2025 0852   AST 22 02/13/2016 1149   ALT 22 01/06/2025 0852   ALT 18 02/13/2016 1149   BILITOT 0.3 01/06/2025 0852   BILITOT 0.49 02/13/2016 1149       Impression and Plan: Alan Shah is a very pleasant 74 yo African American gentleman with renal failure secondary to IgG kappa myeloma/smoldering myeloma.   At this point, I think that after this cycle, we will try to go every other week.  I think this would be reasonable for him.  I am happy that he has done so well.  We will go ahead and plan to get him back in another month.    [name], MD 2/6/20269:39 AM     [1] No Known Allergies  "

## 2025-01-06 NOTE — Patient Instructions (Signed)
 Bortezomib  Injection What is this medication? BORTEZOMIB  (bor TEZ oh mib) treats lymphoma. It may also be used to treat multiple myeloma, a type of bone marrow cancer. It works by blocking a protein that causes cancer cells to grow and multiply. This helps to slow or stop the spread of cancer cells. This medicine may be used for other purposes; ask your health care provider or pharmacist if you have questions. COMMON BRAND NAME(S): BORUZU , Velcade  What should I tell my care team before I take this medication? They need to know if you have any of these conditions: Dehydration Diabetes Heart disease Liver disease Tingling of the fingers or toes or other nerve disorder An unusual or allergic reaction to bortezomib , other medications, foods, dyes, or preservatives If you or your partner are pregnant or trying to get pregnant Breastfeeding How should I use this medication? This medication is injected into a vein or under the skin. It is given by your care team in a hospital or clinic setting. Talk to your care team about the use of this medication in children. Special care may be needed. Overdosage: If you think you have taken too much of this medicine contact a poison control center or emergency room at once. NOTE: This medicine is only for you. Do not share this medicine with others. What if I miss a dose? Keep appointments for follow-up doses. It is important not to miss your dose. Call your care team if you are unable to keep an appointment. What may interact with this medication? Ketoconazole Rifampin This list may not describe all possible interactions. Give your health care provider a list of all the medicines, herbs, non-prescription drugs, or dietary supplements you use. Also tell them if you smoke, drink alcohol, or use illegal drugs. Some items may interact with your medicine. What should I watch for while using this medication? Your condition will be monitored carefully while you  are receiving this medication. You may need blood work while taking this medication. This medication may affect your coordination, reaction time, or judgment. Do not drive or operate machinery until you know how this medication affects you. Sit up or stand slowly to reduce the risk of dizzy or fainting spells. Drinking alcohol with this medication can increase the risk of these side effects. This medication may increase your risk of getting an infection. Call your care team for advice if you get a fever, chills, sore throat, or other symptoms of a cold or flu. Do not treat yourself. Try to avoid being around people who are sick. Check with your care team if you have severe diarrhea, nausea, and vomiting, or if you sweat a lot. The loss of too much body fluid may make it dangerous for you to take this medication. Talk to your care team if you may be pregnant. Serious birth defects can occur if you take this medication during pregnancy and for 7 months after the last dose. You will need a negative pregnancy test before starting this medication. Contraception is recommended while taking this medication and for 7 months after the last dose. Your care team can help you find the option that works for you. If your partner can get pregnant, use a condom during sex while taking this medication and for 4 months after the last dose. Do not breastfeed while taking this medication and for 2 months after the last dose. This medication may cause infertility. Talk to your care team if you are concerned about your fertility. What side  effects may I notice from receiving this medication? Side effects that you should report to your care team as soon as possible: Allergic reactions--skin rash, itching, hives, swelling of the face, lips, tongue, or throat Bleeding--bloody or black, tar-like stools, vomiting blood or brown material that looks like coffee grounds, red or dark brown urine, small red or purple spots on skin,  unusual bruising or bleeding Bleeding in the brain--severe headache, stiff neck, confusion, dizziness, change in vision, numbness or weakness of the face, arm, or leg, trouble speaking, trouble walking, vomiting Bowel blockage--stomach cramping, unable to have a bowel movement or pass gas, loss of appetite, vomiting Heart failure--shortness of breath, swelling of the ankles, feet, or hands, sudden weight gain, unusual weakness or fatigue Infection--fever, chills, cough, sore throat, wounds that don't heal, pain or trouble when passing urine, general feeling of discomfort or being unwell Liver injury--right upper belly pain, loss of appetite, nausea, light-colored stool, dark yellow or brown urine, yellowing skin or eyes, unusual weakness or fatigue Low blood pressure--dizziness, feeling faint or lightheaded, blurry vision Lung injury--shortness of breath or trouble breathing, cough, spitting up blood, chest pain, fever Pain, tingling, or numbness in the hands or feet Severe or prolonged diarrhea Stomach pain, bloody diarrhea, pale skin, unusual weakness or fatigue, decrease in the amount of urine, which may be signs of hemolytic uremic syndrome Sudden and severe headache, confusion, change in vision, seizures, which may be signs of posterior reversible encephalopathy syndrome (PRES) TTP--purple spots on the skin or inside the mouth, pale skin, yellowing skin or eyes, unusual weakness or fatigue, fever, fast or irregular heartbeat, confusion, change in vision, trouble speaking, trouble walking Tumor lysis syndrome (TLS)--nausea, vomiting, diarrhea, decrease in the amount of urine, dark urine, unusual weakness or fatigue, confusion, muscle pain or cramps, fast or irregular heartbeat, joint pain Side effects that usually do not require medical attention (report to your care team if they continue or are bothersome): Constipation Diarrhea Fatigue Loss of appetite Nausea This list may not describe all  possible side effects. Call your doctor for medical advice about side effects. You may report side effects to FDA at 1-800-FDA-1088. Where should I keep my medication? This medication is given in a hospital or clinic. It will not be stored at home. NOTE: This sheet is a summary. It may not cover all possible information. If you have questions about this medicine, talk to your doctor, pharmacist, or health care provider.  2024 Elsevier/Gold Standard (2022-04-22 00:00:00)Daratumumab ; Hyaluronidase  Injection What is this medication? DARATUMUMAB ; HYALURONIDASE  (dar a toom ue mab; hye al ur ON i dase) treats multiple myeloma, a type of bone marrow cancer. Daratumumab  works by blocking a protein that causes cancer cells to grow and multiply. This helps to slow or stop the spread of cancer cells. Hyaluronidase  works by increasing the absorption of other medications in the body to help them work better. This medication may also be used treat amyloidosis, a condition that causes the buildup of a protein (amyloid) in your body. It works by reducing the buildup of this protein, which decreases symptoms. It is a combination medication that contains a monoclonal antibody. This medicine may be used for other purposes; ask your health care provider or pharmacist if you have questions. COMMON BRAND NAME(S): DARZALEX  FASPRO What should I tell my care team before I take this medication? They need to know if you have any of these conditions: Heart disease Infection, such as chickenpox, cold sores, herpes, hepatitis B Lung or breathing disease  An unusual or allergic reaction to daratumumab , hyaluronidase , other medications, foods, dyes, or preservatives Pregnant or trying to get pregnant Breast-feeding How should I use this medication? This medication is injected under the skin. It is given by your care team in a hospital or clinic setting. Talk to your care team about the use of this medication in children. Special  care may be needed. Overdosage: If you think you have taken too much of this medicine contact a poison control center or emergency room at once. NOTE: This medicine is only for you. Do not share this medicine with others. What if I miss a dose? Keep appointments for follow-up doses. It is important not to miss your dose. Call your care team if you are unable to keep an appointment. What may interact with this medication? Interactions have not been studied. This list may not describe all possible interactions. Give your health care provider a list of all the medicines, herbs, non-prescription drugs, or dietary supplements you use. Also tell them if you smoke, drink alcohol, or use illegal drugs. Some items may interact with your medicine. What should I watch for while using this medication? Your condition will be monitored carefully while you are receiving this medication. This medication can cause serious allergic reactions. To reduce your risk, your care team may give you other medication to take before receiving this one. Be sure to follow the directions from your care team. This medication can affect the results of blood tests to match your blood type. These changes can last for up to 6 months after the final dose. Your care team will do blood tests to match your blood type before you start treatment. Tell all of your care team that you are being treated with this medication before receiving a blood transfusion. This medication can affect the results of some tests used to determine treatment response; extra tests may be needed to evaluate response. Talk to your care team if you wish to become pregnant or think you are pregnant. This medication can cause serious birth defects if taken during pregnancy and for 3 months after the last dose. A reliable form of contraception is recommended while taking this medication and for 3 months after the last dose. Talk to your care team about effective forms of  contraception. Do not breast-feed while taking this medication. What side effects may I notice from receiving this medication? Side effects that you should report to your care team as soon as possible: Allergic reactions--skin rash, itching, hives, swelling of the face, lips, tongue, or throat Heart rhythm changes--fast or irregular heartbeat, dizziness, feeling faint or lightheaded, chest pain, trouble breathing Infection--fever, chills, cough, sore throat, wounds that don't heal, pain or trouble when passing urine, general feeling of discomfort or being unwell Infusion reactions--chest pain, shortness of breath or trouble breathing, feeling faint or lightheaded Sudden eye pain or change in vision such as blurry vision, seeing halos around lights, vision loss Unusual bruising or bleeding Side effects that usually do not require medical attention (report to your care team if they continue or are bothersome): Constipation Diarrhea Fatigue Nausea Pain, tingling, or numbness in the hands or feet Swelling of the ankles, hands, or feet This list may not describe all possible side effects. Call your doctor for medical advice about side effects. You may report side effects to FDA at 1-800-FDA-1088. Where should I keep my medication? This medication is given in a hospital or clinic. It will not be stored at home. NOTE:  This sheet is a summary. It may not cover all possible information. If you have questions about this medicine, talk to your doctor, pharmacist, or health care provider.  2024 Elsevier/Gold Standard (2022-03-25 00:00:00)Darbepoetin Alfa Injection What is this medication? DARBEPOETIN ALFA (dar be POE e tin AL fa) treats low levels of red blood cells (anemia) caused by kidney disease or chemotherapy. It works by systems analyst make more red blood cells, which reduces the need for blood transfusions. This medicine may be used for other purposes; ask your health care provider or  pharmacist if you have questions. COMMON BRAND NAME(S): Aranesp What should I tell my care team before I take this medication? They need to know if you have any of these conditions: Blood clots Cancer Heart disease High blood pressure On dialysis Seizures Stroke An unusual or allergic reaction to darbepoetin, latex, other medications, foods, dyes, or preservatives Pregnant or trying to get pregnant Breast-feeding How should I use this medication? This medication is injected into a vein or under the skin. It is usually given by a care team in a hospital or clinic setting. It may also be given at home. If you get this medication at home, you will be taught how to prepare and give it. Use exactly as directed. Take it as directed on the prescription label at the same time every day. Keep taking it unless your care team tells you to stop. It is important that you put your used needles and syringes in a special sharps container. Do not put them in a trash can. If you do not have a sharps container, call your pharmacist or care team to get one. A special MedGuide will be given to you by the pharmacist with each prescription and refill. Be sure to read this information carefully each time. Talk to your care team about the use of this medication in children. While this medication may be used in children as young as 1 month of age for selected conditions, precautions do apply. Overdosage: If you think you have taken too much of this medicine contact a poison control center or emergency room at once. NOTE: This medicine is only for you. Do not share this medicine with others. What if I miss a dose? If you miss a dose, take it as soon as you can. If it is almost time for your next dose, take only that dose. Do not take double or extra doses. What may interact with this medication? Epoetin alfa Methoxy polyethylene glycol-epoetin beta This list may not describe all possible interactions. Give your  health care provider a list of all the medicines, herbs, non-prescription drugs, or dietary supplements you use. Also tell them if you smoke, drink alcohol, or use illegal drugs. Some items may interact with your medicine. What should I watch for while using this medication? Visit your care team for regular checks on your progress. Check your blood pressure as directed. Know what your blood pressure should be and when to contact your care team. Your condition will be monitored carefully while you are receiving this medication. You may need blood work while taking this medication. What side effects may I notice from receiving this medication? Side effects that you should report to your care team as soon as possible: Allergic reactions--skin rash, itching, hives, swelling of the face, lips, tongue, or throat Blood clot--pain, swelling, or warmth in the leg, shortness of breath, chest pain Heart attack--pain or tightness in the chest, shoulders, arms, or jaw, nausea,  shortness of breath, cold or clammy skin, feeling faint or lightheaded Increase in blood pressure Rash, fever, and swollen lymph nodes Redness, blistering, peeling, or loosening of the skin, including inside the mouth Seizures Stroke--sudden numbness or weakness of the face, arm, or leg, trouble speaking, confusion, trouble walking, loss of balance or coordination, dizziness, severe headache, change in vision Side effects that usually do not require medical attention (report to your care team if they continue or are bothersome): Cough Stomach pain Swelling of the ankles, hands, or feet This list may not describe all possible side effects. Call your doctor for medical advice about side effects. You may report side effects to FDA at 1-800-FDA-1088. Where should I keep my medication? Keep out of the reach of children and pets. Store in a refrigerator. Do not freeze. Do not shake. Protect from light. Keep this medication in the original  container until you are ready to take it. See product for storage information. Get rid of any unused medication after the expiration date. To get rid of medications that are no longer needed or have expired: Take the medication to a medication take-back program. Check with your pharmacy or law enforcement to find a location. If you cannot return the medication, ask your pharmacist or care team how to get rid of the medication safely. NOTE: This sheet is a summary. It may not cover all possible information. If you have questions about this medicine, talk to your doctor, pharmacist, or health care provider.  2024 Elsevier/Gold Standard (2022-03-19 00:00:00)

## 2025-01-13 ENCOUNTER — Inpatient Hospital Stay

## 2025-01-20 ENCOUNTER — Inpatient Hospital Stay

## 2025-01-27 ENCOUNTER — Inpatient Hospital Stay

## 2025-02-03 ENCOUNTER — Inpatient Hospital Stay: Attending: Hematology & Oncology | Admitting: Hematology & Oncology

## 2025-02-03 ENCOUNTER — Inpatient Hospital Stay

## 2025-03-03 ENCOUNTER — Inpatient Hospital Stay

## 2025-03-03 ENCOUNTER — Inpatient Hospital Stay: Admitting: Hematology & Oncology
